# Patient Record
Sex: Female | Born: 2000 | Race: White | Hispanic: No | Marital: Single | Smoking: Never smoker
Health system: Southern US, Community
[De-identification: ages and names within clinical notes are randomized; demographics above are authoritative.]

## PROBLEM LIST (undated history)

## (undated) DIAGNOSIS — F419 Anxiety disorder, unspecified: Secondary | ICD-10-CM

## (undated) DIAGNOSIS — F32A Depression, unspecified: Secondary | ICD-10-CM

## (undated) HISTORY — PX: NO PAST SURGERIES: SHX2092

---

## 2021-12-01 NOTE — L&D Delivery Note (Addendum)
Operative Delivery Note Indication for operative vaginal delivery: Prolonged second stage  Patient was examined and found to be fully dilated with fetal station of +3.  Patient's bladder was noted to be empty, and there were no known fetal contraindications to operative vaginal delivery.  FHR tracing was Category I.  Risks of vacuum assistance were discussed in detail, including but not limited to, bleeding, infection, damage to maternal tissues, fetal cephalohematoma, inability to effect vaginal delivery of the head or shoulder dystocia that cannot be resolved by established maneuvers and need for emergency cesarean section.  Patient gave verbal consent.  The  Kiwi soft vacuum cup was positioned over the sagittal suture 3 cm anterior to posterior fontanelle.  Pressure was then increased to 500 mmHg, and the patient was instructed to push.  Pulling was administered along the pelvic curve while patient was pushing; there were 4 sets of contractions and one popoff. A midline episiotomy had to be performed to aid with delivery.  Vacuum was reduced in between contractions.  The infant was then delivered atraumatically at 0100, noted to be a viable female infant, Apgars of 9 and 9.  Tight nuchal cord x 1, reduced after delivery of head.  Neonatology present for delivery.  There was spontaneous placental delivery, intact with three-vessel cord.  The Mirena IUD was placed into fundal region of the uterus after delivery of the placenta, and the strings were cut to at the level of introitus.  The 2nd degree perineal laceration noted requiring repair with 3-0 Vicryl in the usual fashion. EBL 202 ml, epidural anesthesia in place.   Sponge, instrument and needle counts were correct x 2.  The patient and baby were stable after delivery and remained in couplet care, with plans to transfer later to postpartum unit  Vacuum assisted delivery was performed by Dr. Patrcia Dolly and I was available for assistance and  supervision during the delivery.    Jaynie Collins, MD, FACOG Obstetrician & Gynecologist, [facility] for [employer], [facility]

## 2022-01-09 ENCOUNTER — Other Ambulatory Visit: Payer: Self-pay

## 2022-01-09 ENCOUNTER — Encounter (HOSPITAL_COMMUNITY): Payer: Self-pay | Admitting: Obstetrics and Gynecology

## 2022-01-09 ENCOUNTER — Inpatient Hospital Stay (HOSPITAL_COMMUNITY)
Admission: AD | Admit: 2022-01-09 | Discharge: 2022-01-09 | Disposition: A | Discharge status: Home / Self Care | Payer: Medicaid Other | Attending: Obstetrics and Gynecology | Admitting: Obstetrics and Gynecology

## 2022-01-09 DIAGNOSIS — O26892 Other specified pregnancy related conditions, second trimester: Secondary | ICD-10-CM | POA: Diagnosis not present

## 2022-01-09 DIAGNOSIS — R109 Unspecified abdominal pain: Secondary | ICD-10-CM

## 2022-01-09 DIAGNOSIS — Z3A15 15 weeks gestation of pregnancy: Secondary | ICD-10-CM

## 2022-01-09 DIAGNOSIS — O99612 Diseases of the digestive system complicating pregnancy, second trimester: Secondary | ICD-10-CM | POA: Diagnosis not present

## 2022-01-09 DIAGNOSIS — K5900 Constipation, unspecified: Secondary | ICD-10-CM | POA: Diagnosis not present

## 2022-01-09 HISTORY — DX: Depression, unspecified: F32.A

## 2022-01-09 HISTORY — DX: Anxiety disorder, unspecified: F41.9

## 2022-01-09 LAB — URINALYSIS, ROUTINE W REFLEX MICROSCOPIC
Bilirubin Urine: NEGATIVE
Glucose, UA: 50 mg/dL — AB
Hgb urine dipstick: NEGATIVE
Ketones, ur: NEGATIVE mg/dL
Nitrite: NEGATIVE
Protein, ur: NEGATIVE mg/dL
Specific Gravity, Urine: 1.018 (ref 1.005–1.030)
pH: 6 (ref 5.0–8.0)

## 2022-01-09 LAB — WET PREP, GENITAL
Clue Cells Wet Prep HPF POC: NONE SEEN
Sperm: NONE SEEN
Trich, Wet Prep: NONE SEEN
WBC, Wet Prep HPF POC: <10 (ref <10)
Yeast Wet Prep HPF POC: NONE SEEN

## 2022-01-09 LAB — POCT PREGNANCY, URINE: Preg Test, Ur: POSITIVE — AB

## 2022-01-09 MED ORDER — POLYETHYLENE GLYCOL 3350 17 GM/SCOOP PO POWD: 17.0000 g | Freq: Every day | ORAL | 1 refills | Status: DC | PRN

## 2022-01-09 NOTE — MAU Note (Signed)
...  Deanna Campbell is a 21 y.o. at [redacted]w[redacted]d here in MAU reporting: Lower abdominal pain for "a couple of weeks now." She states she was evaluated with placenta previa two weeks ago. Has not had a BM since last Saturday. No VB or LOF.  She gets her care in [facility] and is wanting to transfer her care here to [facility] as [facility] does not take her insurance.  Pain score:  7/10 lower abdomen  Vitals:   01/09/22 1812  BP: 119/81  Pulse: (!) 106  Resp: 17  Temp: 99.3 F (37.4 C)  SpO2: 100%     FHT: 166 doppler Lab orders placed from triage:  UA

## 2022-01-09 NOTE — MAU Provider Note (Signed)
History     [id number]  Arrival date and time: 01/09/22 1745    Chief Complaint  Patient presents with   Abdominal Pain     HPI Leighla Chestnutt is a 21 y.o. at [redacted]w[redacted]d by outside [place], who presents for abdominal pain.   Patient reports several weeks of abdominal pain Comes and goes Mainly in lower quadrants  No vaginal bleeding or loss of fluid No burning or pain with urination Has not had BM since five days prior     OB History     Gravida  1   Para      Term      Preterm      AB      Living         SAB      IAB      Ectopic      Multiple      Live Births              Past Medical History:  Diagnosis Date   Anxiety    Depression     Past Surgical History:  Procedure Laterality Date   NO PAST SURGERIES      History reviewed. No pertinent family history.  Social History   Socioeconomic History   Marital status: Single    Spouse name: Not on file   Number of children: Not on file   Years of education: Not on file   Highest education level: Not on file  Occupational History   Not on file  Tobacco Use   Smoking status: Never   Smokeless tobacco: Never  Substance and Sexual Activity   Alcohol use: Never   Drug use: Never   Sexual activity: Not Currently  Other Topics Concern   Not on file  Social History Narrative   Not on file   Social Determinants of Health   Financial Resource Strain: Not on file  Food Insecurity: Not on file  Transportation Needs: Not on file  Physical Activity: Not on file  Stress: Not on file  Social Connections: Not on file  Intimate Partner Violence: Not on file    No Known Allergies  No current facility-administered medications on file prior to encounter.   Current Outpatient Medications on File Prior to Encounter  Medication Sig Dispense Refill   ondansetron (ZOFRAN) 4 MG tablet Take 4 mg by mouth every 8 (eight) hours as needed for nausea or vomiting.     Prenatal Vit-Fe Fumarate-FA  (MULTIVITAMIN-PRENATAL) 27-0.8 MG TABS tablet Take 1 tablet by mouth daily at 12 noon.     promethazine (PHENERGAN) 12.5 MG tablet Take 12.5 mg by mouth every 6 (six) hours as needed for nausea or vomiting.       ROS Pertinent positives and negative per HPI, all others reviewed and negative  Physical Exam   BP 118/69    Pulse 92    Temp 99.3 F (37.4 C) (Oral)    Resp 17    Ht 5\' 6"  (1.676 m)    Wt 62.2 kg    SpO2 100%    BMI 22.13 kg/m   Patient Vitals for the past 24 hrs:  BP Temp Temp src Pulse Resp SpO2 Height Weight  01/09/22 1827 118/69 -- -- 92 -- -- -- --  01/09/22 1812 119/81 99.3 F (37.4 C) Oral (!) 106 17 100 % 5\' 6"  (1.676 m) 62.2 kg    Physical Exam Vitals reviewed.  Constitutional:      General: She  is not in acute distress.    Appearance: She is well-developed. She is not diaphoretic.  Eyes:     General: No scleral icterus. Pulmonary:     Effort: Pulmonary effort is normal. No respiratory distress.  Abdominal:     General: There is no distension.     Palpations: Abdomen is soft.     Tenderness: There is no abdominal tenderness. There is no guarding or rebound.  Skin:    General: Skin is warm and dry.  Neurological:     Mental Status: She is alert.     Coordination: Coordination normal.     Cervical Exam    Bedside Ultrasound Pt informed that the ultrasound is considered a limited OB ultrasound and is not intended to be a complete ultrasound exam.  Patient also informed that the ultrasound is not being completed with the intent of assessing for fetal or placental anomalies or any pelvic abnormalities.  Explained that the purpose of todays ultrasound is to assess for  viability.  Patient acknowledges the purpose of the exam and the limitations of the study.    My interpretation: viable IUP, FHR 164 bpm by M mode, normal somatic movement and fluid, fundal placenta   Labs Results for orders placed or performed during the hospital encounter of 01/09/22  (from the past 24 hour(s))  Pregnancy, urine POC     Status: Abnormal   Collection Time: 01/09/22  6:08 PM  Result Value Ref Range   Preg Test, Ur POSITIVE (A) NEGATIVE  Wet prep, genital     Status: None   Collection Time: 01/09/22  7:13 PM  Result Value Ref Range   Yeast Wet Prep HPF POC NONE SEEN NONE SEEN   Trich, Wet Prep NONE SEEN NONE SEEN   Clue Cells Wet Prep HPF POC NONE SEEN NONE SEEN   WBC, Wet Prep HPF POC <10 <10   Sperm NONE SEEN     Imaging No results found.  MAU Course  Procedures Lab Orders         Wet prep, genital         Urinalysis, Routine w reflex microscopic Urine, Clean Catch         Pregnancy, urine POC    Meds ordered this encounter  Medications   polyethylene glycol powder (GLYCOLAX/MIRALAX) 17 GM/SCOOP powder    Sig: Take 17 g by mouth daily as needed.    Dispense:  510 g    Refill:  1   Imaging Orders  No imaging studies ordered today    MDM moderate  Assessment and Plan  #Abdominal pain in pregnancy, second trimester #[redacted] weeks gestation of pregnancy Suspect secondary to constipation. Unremarkable bedside US, no other notable symptoms. Wet prep negative. Given rx for miralax, reviewed routine precautions.   #FWB Normal fetal cardiac motion on bedside US   Dispo: discharged to home in stable condition.    Venora Maples, MD/MPH 01/09/22 7:39 PM  Allergies as of 01/09/2022   No Known Allergies      Medication List     TAKE these medications    multivitamin-prenatal 27-0.8 MG Tabs tablet Take 1 tablet by mouth daily at 12 noon.   ondansetron 4 MG tablet Commonly known as: ZOFRAN Take 4 mg by mouth every 8 (eight) hours as needed for nausea or vomiting.   polyethylene glycol powder 17 GM/SCOOP powder Commonly known as: GLYCOLAX/MIRALAX Take 17 g by mouth daily as needed.   promethazine 12.5 MG tablet Commonly known  as: PHENERGAN Take 12.5 mg by mouth every 6 (six) hours as needed for nausea or vomiting.

## 2022-01-10 LAB — GC/CHLAMYDIA PROBE AMP (~~LOC~~) NOT AT ARMC
Chlamydia: NEGATIVE
Comment: NEGATIVE
Comment: NORMAL
Neisseria Gonorrhea: NEGATIVE

## 2022-01-16 ENCOUNTER — Other Ambulatory Visit: Payer: Self-pay

## 2022-01-16 ENCOUNTER — Inpatient Hospital Stay (HOSPITAL_COMMUNITY)
Admission: AD | Admit: 2022-01-16 | Discharge: 2022-01-16 | Disposition: A | Discharge status: Home / Self Care | Payer: Medicaid Other | Attending: Obstetrics & Gynecology | Admitting: Obstetrics & Gynecology

## 2022-01-16 DIAGNOSIS — O26892 Other specified pregnancy related conditions, second trimester: Secondary | ICD-10-CM | POA: Insufficient documentation

## 2022-01-16 DIAGNOSIS — W109XXA Fall (on) (from) unspecified stairs and steps, initial encounter: Secondary | ICD-10-CM | POA: Diagnosis not present

## 2022-01-16 DIAGNOSIS — Z6791 Unspecified blood type, Rh negative: Secondary | ICD-10-CM | POA: Diagnosis not present

## 2022-01-16 DIAGNOSIS — R109 Unspecified abdominal pain: Secondary | ICD-10-CM | POA: Insufficient documentation

## 2022-01-16 DIAGNOSIS — Z3A16 16 weeks gestation of pregnancy: Secondary | ICD-10-CM | POA: Insufficient documentation

## 2022-01-16 DIAGNOSIS — S3991XA Unspecified injury of abdomen, initial encounter: Secondary | ICD-10-CM | POA: Diagnosis not present

## 2022-01-16 DIAGNOSIS — O26899 Other specified pregnancy related conditions, unspecified trimester: Secondary | ICD-10-CM

## 2022-01-16 DIAGNOSIS — O219 Vomiting of pregnancy, unspecified: Secondary | ICD-10-CM | POA: Diagnosis not present

## 2022-01-16 DIAGNOSIS — O9A212 Injury, poisoning and certain other consequences of external causes complicating pregnancy, second trimester: Secondary | ICD-10-CM | POA: Diagnosis not present

## 2022-01-16 DIAGNOSIS — O2342 Unspecified infection of urinary tract in pregnancy, second trimester: Secondary | ICD-10-CM | POA: Diagnosis not present

## 2022-01-16 LAB — ABO/RH: ABO/RH(D): O NEG

## 2022-01-16 MED ORDER — RHO D IMMUNE GLOBULIN 1500 UNIT/2ML IJ SOSY
300.0000 ug | PREFILLED_SYRINGE | Freq: Once | INTRAMUSCULAR | Status: AC
  Administered 2022-01-16: 300 ug via INTRAMUSCULAR
  Filled 2022-01-16: qty 2

## 2022-01-16 NOTE — Progress Notes (Signed)
Pending rhogam administration

## 2022-01-16 NOTE — Progress Notes (Signed)
RN notified Blood Bank regarding status of Rhophylac preparation.  Informed not ready, will notify once ready to dispense.

## 2022-01-16 NOTE — MAU Provider Note (Signed)
History     [id number]  Arrival date and time: 01/16/22 1205    Chief Complaint  Patient presents with   Fall     HPI Deanna Campbell is a 21 y.o. at [redacted]w[redacted]d by outside [place], who presents for abdominal trauma.   Patient seen by myself last week with abdominal pain Viable IUP seen on bedside US, discharged  Today reports that she fell down the stairs two days ago and hit her abdomen Has ongoing cramping and intermittent pain No vaginal bleeding No burning or pain with urination No vaginal discharge   --/--/O NEG (02/16 1253)  OB History     Gravida  1   Para      Term      Preterm      AB      Living         SAB      IAB      Ectopic      Multiple      Live Births              Past Medical History:  Diagnosis Date   Anxiety    Depression     Past Surgical History:  Procedure Laterality Date   NO PAST SURGERIES      No family history on file.  Social History   Socioeconomic History   Marital status: Single    Spouse name: Not on file   Number of children: Not on file   Years of education: Not on file   Highest education level: Not on file  Occupational History   Not on file  Tobacco Use   Smoking status: Never   Smokeless tobacco: Never  Substance and Sexual Activity   Alcohol use: Never   Drug use: Never   Sexual activity: Not Currently  Other Topics Concern   Not on file  Social History Narrative   Not on file   Social Determinants of Health   Financial Resource Strain: Not on file  Food Insecurity: Not on file  Transportation Needs: Not on file  Physical Activity: Not on file  Stress: Not on file  Social Connections: Not on file  Intimate Partner Violence: Not on file    No Known Allergies  No current facility-administered medications on file prior to encounter.   Current Outpatient Medications on File Prior to Encounter  Medication Sig Dispense Refill   ondansetron (ZOFRAN) 4 MG tablet Take 4 mg by mouth every 8  (eight) hours as needed for nausea or vomiting.     polyethylene glycol powder (GLYCOLAX/MIRALAX) 17 GM/SCOOP powder Take 17 g by mouth daily as needed. 510 g 1   Prenatal Vit-Fe Fumarate-FA (MULTIVITAMIN-PRENATAL) 27-0.8 MG TABS tablet Take 1 tablet by mouth daily at 12 noon.     promethazine (PHENERGAN) 12.5 MG tablet Take 12.5 mg by mouth every 6 (six) hours as needed for nausea or vomiting.       ROS Pertinent positives and negative per HPI, all others reviewed and negative  Physical Exam   BP 113/63 (BP Location: Right Arm)    Pulse 97    Temp 98 F (36.7 C) (Oral)    Resp 18    Ht 5\' 6"  (1.676 m)    Wt 61.1 kg    SpO2 100%    BMI 21.73 kg/m   Patient Vitals for the past 24 hrs:  BP Temp Temp src Pulse Resp SpO2 Height Weight  01/16/22 1225 113/63 98 F (36.7 C)  Oral 97 18 100 % -- --  01/16/22 1221 -- -- -- -- -- -- 5\' 6"  (1.676 m) 61.1 kg    Physical Exam Vitals reviewed.  Constitutional:      General: She is not in acute distress.    Appearance: She is well-developed. She is not diaphoretic.  Eyes:     General: No scleral icterus. Pulmonary:     Effort: Pulmonary effort is normal. No respiratory distress.  Skin:    General: Skin is warm and dry.  Neurological:     Mental Status: She is alert.     Coordination: Coordination normal.     Cervical Exam    Bedside Ultrasound Not done  My interpretation: n/a  FHT FHR 148 bpm by doppler  Labs Results for orders placed or performed during the hospital encounter of 01/16/22 (from the past 24 hour(s))  Rh IG workup (includes ABO/Rh)     Status: None (Preliminary result)   Collection Time: 01/16/22 12:49 PM  Result Value Ref Range   Gestational Age(Wks) 16    ABO/RH(D)      O NEG Performed at [facility], [address] [place], [place]    Antibody Screen PENDING   ABO/Rh     Status: None   Collection Time: 01/16/22 12:53 PM  Result Value Ref Range   ABO/RH(D) O NEG     Imaging No  results found.  MAU Course  Procedures Lab Orders  No laboratory test(s) ordered today   Meds ordered this encounter  Medications   rho (d) immune globulin (RHIG/RHOPHYLAC) injection 300 mcg   Imaging Orders  No imaging studies ordered today    MDM moderate  Assessment and Plan  #Abdominal trauma in pregnancy, second trimester #[redacted] weeks gestation of pregnancy #Rh neg blood type Patient with already known viable IUP presenting two days after abdominal trauma. Reassured patient that in absence of bleeding and with normal FHR that fetal well being appears to be good. Given direct abdominal trauma will be conservative and give rhogam given she is O neg blood type.    #FWB FHT normal by doppler   Dispo: discharged to home in stable condition.    01/18/22, MD/MPH 01/16/22 1:57 PM  Allergies as of 01/16/2022   No Known Allergies      Medication List     TAKE these medications    multivitamin-prenatal 27-0.8 MG Tabs tablet Take 1 tablet by mouth daily at 12 noon.   ondansetron 4 MG tablet Commonly known as: ZOFRAN Take 4 mg by mouth every 8 (eight) hours as needed for nausea or vomiting.   polyethylene glycol powder 17 GM/SCOOP powder Commonly known as: GLYCOLAX/MIRALAX Take 17 g by mouth daily as needed.   promethazine 12.5 MG tablet Commonly known as: PHENERGAN Take 12.5 mg by mouth every 6 (six) hours as needed for nausea or vomiting.

## 2022-01-16 NOTE — MAU Note (Signed)
Presents stating she fell down the steps 2 days ago and struck abdomen.  Reports abdomen has been sore since fall.  Denies VB or LOF.

## 2022-01-17 ENCOUNTER — Inpatient Hospital Stay (HOSPITAL_COMMUNITY)
Admission: AD | Admit: 2022-01-17 | Discharge: 2022-01-17 | Disposition: A | Discharge status: Home / Self Care | Payer: Medicaid Other | Attending: Obstetrics & Gynecology | Admitting: Obstetrics & Gynecology

## 2022-01-17 ENCOUNTER — Ambulatory Visit: Payer: Self-pay | Admitting: *Deleted

## 2022-01-17 ENCOUNTER — Encounter (HOSPITAL_COMMUNITY): Payer: Self-pay | Admitting: Obstetrics & Gynecology

## 2022-01-17 DIAGNOSIS — M549 Dorsalgia, unspecified: Secondary | ICD-10-CM | POA: Insufficient documentation

## 2022-01-17 DIAGNOSIS — O26892 Other specified pregnancy related conditions, second trimester: Secondary | ICD-10-CM | POA: Insufficient documentation

## 2022-01-17 DIAGNOSIS — N390 Urinary tract infection, site not specified: Secondary | ICD-10-CM | POA: Insufficient documentation

## 2022-01-17 DIAGNOSIS — O219 Vomiting of pregnancy, unspecified: Secondary | ICD-10-CM

## 2022-01-17 DIAGNOSIS — O2342 Unspecified infection of urinary tract in pregnancy, second trimester: Secondary | ICD-10-CM | POA: Diagnosis not present

## 2022-01-17 DIAGNOSIS — R109 Unspecified abdominal pain: Secondary | ICD-10-CM | POA: Diagnosis not present

## 2022-01-17 DIAGNOSIS — Z3A16 16 weeks gestation of pregnancy: Secondary | ICD-10-CM

## 2022-01-17 LAB — URINALYSIS, ROUTINE W REFLEX MICROSCOPIC
Bilirubin Urine: NEGATIVE
Glucose, UA: NEGATIVE mg/dL
Hgb urine dipstick: NEGATIVE
Ketones, ur: 80 mg/dL — AB
Nitrite: POSITIVE — AB
Protein, ur: 30 mg/dL — AB
Specific Gravity, Urine: 1.021 (ref 1.005–1.030)
pH: 7 (ref 5.0–8.0)

## 2022-01-17 LAB — CBC WITH DIFFERENTIAL/PLATELET
Abs Immature Granulocytes: 0.04 10*3/uL (ref 0.00–0.07)
Basophils Absolute: 0 10*3/uL (ref 0.0–0.1)
Basophils Relative: 0 %
Eosinophils Absolute: 0 10*3/uL (ref 0.0–0.5)
Eosinophils Relative: 0 %
HCT: 34.1 % — ABNORMAL LOW (ref 36.0–46.0)
Hemoglobin: 11.8 g/dL — ABNORMAL LOW (ref 12.0–15.0)
Immature Granulocytes: 0 %
Lymphocytes Relative: 4 %
Lymphs Abs: 0.4 10*3/uL — ABNORMAL LOW (ref 0.7–4.0)
MCH: 31.3 pg (ref 26.0–34.0)
MCHC: 34.6 g/dL (ref 30.0–36.0)
MCV: 90.5 fL (ref 80.0–100.0)
Monocytes Absolute: 0.3 10*3/uL (ref 0.1–1.0)
Monocytes Relative: 3 %
Neutro Abs: 9.2 10*3/uL — ABNORMAL HIGH (ref 1.7–7.7)
Neutrophils Relative %: 93 %
Platelets: 259 10*3/uL (ref 150–400)
RBC: 3.77 MIL/uL — ABNORMAL LOW (ref 3.87–5.11)
RDW: 13.1 % (ref 11.5–15.5)
WBC: 10 10*3/uL (ref 4.0–10.5)
nRBC: 0 % (ref 0.0–0.2)

## 2022-01-17 LAB — COMPREHENSIVE METABOLIC PANEL
ALT: 12 U/L (ref 0–44)
AST: 16 U/L (ref 15–41)
Albumin: 3.5 g/dL (ref 3.5–5.0)
Alkaline Phosphatase: 63 U/L (ref 38–126)
Anion gap: 9 (ref 5–15)
BUN: 5 mg/dL — ABNORMAL LOW (ref 6–20)
CO2: 21 mmol/L — ABNORMAL LOW (ref 22–32)
Calcium: 9.3 mg/dL (ref 8.9–10.3)
Chloride: 106 mmol/L (ref 98–111)
Creatinine, Ser: 0.58 mg/dL (ref 0.44–1.00)
GFR, Estimated: >60 mL/min (ref >60)
Glucose, Bld: 99 mg/dL (ref 70–99)
Potassium: 3.8 mmol/L (ref 3.5–5.1)
Sodium: 136 mmol/L (ref 135–145)
Total Bilirubin: 0.5 mg/dL (ref 0.3–1.2)
Total Protein: 7 g/dL (ref 6.5–8.1)

## 2022-01-17 LAB — RH IG WORKUP (INCLUDES ABO/RH)
ABO/RH(D): O NEG
Antibody Screen: POSITIVE
Gestational Age(Wks): 16
Unit division: 0

## 2022-01-17 LAB — LIPASE, BLOOD: Lipase: 25 U/L (ref 11–51)

## 2022-01-17 MED ORDER — ONDANSETRON HCL 4 MG PO TABS: 4.0000 mg | ORAL_TABLET | Freq: Three times a day (TID) | ORAL | 2 refills | Status: DC | PRN

## 2022-01-17 MED ORDER — SODIUM CHLORIDE 0.9 % IV SOLN
2.0000 g | INTRAVENOUS | Status: DC
  Administered 2022-01-17: 2 g via INTRAVENOUS
  Filled 2022-01-17: qty 20

## 2022-01-17 MED ORDER — FAMOTIDINE IN NACL 20-0.9 MG/50ML-% IV SOLN
20.0000 mg | Freq: Once | INTRAVENOUS | Status: AC
  Administered 2022-01-17: 20 mg via INTRAVENOUS
  Filled 2022-01-17: qty 50

## 2022-01-17 MED ORDER — SODIUM CHLORIDE 0.9 % IV SOLN: INTRAVENOUS | Status: DC

## 2022-01-17 MED ORDER — LACTATED RINGERS IV BOLUS
1000.0000 mL | Freq: Once | INTRAVENOUS | Status: AC
Start: 2022-01-17 — End: 2022-01-17
  Administered 2022-01-17: 1000 mL via INTRAVENOUS

## 2022-01-17 MED ORDER — SODIUM CHLORIDE 0.9 % IV SOLN
8.0000 mg | Freq: Once | INTRAVENOUS | Status: AC
  Administered 2022-01-17: 8 mg via INTRAVENOUS
  Filled 2022-01-17: qty 4

## 2022-01-17 MED ORDER — CEFADROXIL 500 MG PO CAPS: 500.0000 mg | ORAL_CAPSULE | Freq: Two times a day (BID) | ORAL | 0 refills | Status: DC

## 2022-01-17 NOTE — MAU Provider Note (Signed)
History     CSN: [id number]  Arrival date and time: 01/17/22 [id number]   Event Date/Time   First Provider Initiated Contact with Patient 01/17/22 1121      Chief Complaint  Patient presents with   Emesis   Nausea   Dizziness   HPI  Ms.Deanna Campbell is a 21 y.o. female G1P0 @ [redacted]w[redacted]d who presents with nausea and vomiting. This is a new problem. She reports vomiting daily 1x up until yesterday when the vomiting became worse. She has vomited more than 10 times in the last 24 hours. She reports stomach cramping. Which is not a new problem. She has had cramping throughout the pregnancy. She has no sick contacts. No fever, no diarrhea. She has not taken anything for the nausea.   OB History     Gravida  1   Para      Term      Preterm      AB      Living         SAB      IAB      Ectopic      Multiple      Live Births              Past Medical History:  Diagnosis Date   Anxiety    Depression     Past Surgical History:  Procedure Laterality Date   NO PAST SURGERIES      No family history on file.  Social History   Tobacco Use   Smoking status: Never   Smokeless tobacco: Never  Substance Use Topics   Alcohol use: Never   Drug use: Never    Allergies: No Known Allergies  Medications Prior to Admission  Medication Sig Dispense Refill Last Dose   ondansetron (ZOFRAN) 4 MG tablet Take 4 mg by mouth every 8 (eight) hours as needed for nausea or vomiting.   01/17/2022   Prenatal Vit-Fe Fumarate-FA (MULTIVITAMIN-PRENATAL) 27-0.8 MG TABS tablet Take 1 tablet by mouth daily at 12 noon.   01/16/2022   promethazine (PHENERGAN) 12.5 MG tablet Take 12.5 mg by mouth every 6 (six) hours as needed for nausea or vomiting.   01/17/2022   polyethylene glycol powder (GLYCOLAX/MIRALAX) 17 GM/SCOOP powder Take 17 g by mouth daily as needed. 510 g 1    Results for orders placed or performed during the hospital encounter of 01/17/22 (from the past 72 hour(s))  Urinalysis,  Routine w reflex microscopic Urine, Clean Catch     Status: Abnormal   Collection Time: 01/17/22 10:44 AM  Result Value Ref Range   Color, Urine AMBER (A) YELLOW    Comment: BIOCHEMICALS MAY BE AFFECTED BY COLOR   APPearance CLOUDY (A) CLEAR   Specific Gravity, Urine 1.021 1.005 - 1.030   pH 7.0 5.0 - 8.0   Glucose, UA NEGATIVE NEGATIVE mg/dL   Hgb urine dipstick NEGATIVE NEGATIVE   Bilirubin Urine NEGATIVE NEGATIVE   Ketones, ur 80 (A) NEGATIVE mg/dL   Protein, ur 30 (A) NEGATIVE mg/dL   Nitrite POSITIVE (A) NEGATIVE   Leukocytes,Ua TRACE (A) NEGATIVE   RBC / HPF 0-5 0 - 5 RBC/hpf   WBC, UA 21-50 0 - 5 WBC/hpf   Bacteria, UA MANY (A) NONE SEEN   Squamous Epithelial / LPF 11-20 0 - 5   Mucus PRESENT     Comment: Performed at [facility], [address], [place], [place] [address]  CBC with Differential/Platelet  Status: Abnormal   Collection Time: 01/17/22 11:39 AM  Result Value Ref Range   WBC 10.0 4.0 - 10.5 K/uL   RBC 3.77 (L) 3.87 - 5.11 MIL/uL   Hemoglobin 11.8 (L) 12.0 - 15.0 g/dL   HCT 34.1 (L) 36.0 - 46.0 %   MCV 90.5 80.0 - 100.0 fL   MCH 31.3 26.0 - 34.0 pg   MCHC 34.6 30.0 - 36.0 g/dL   RDW 13.1 11.5 - 15.5 %   Platelets 259 150 - 400 K/uL   nRBC 0.0 0.0 - 0.2 %   Neutrophils Relative % 93 %   Neutro Abs 9.2 (H) 1.7 - 7.7 K/uL   Lymphocytes Relative 4 %   Lymphs Abs 0.4 (L) 0.7 - 4.0 K/uL   Monocytes Relative 3 %   Monocytes Absolute 0.3 0.1 - 1.0 K/uL   Eosinophils Relative 0 %   Eosinophils Absolute 0.0 0.0 - 0.5 K/uL   Basophils Relative 0 %   Basophils Absolute 0.0 0.0 - 0.1 K/uL   Immature Granulocytes 0 %   Abs Immature Granulocytes 0.04 0.00 - 0.07 K/uL    Comment: Performed at [facility], [address], [place], [place] [address]  Comprehensive metabolic panel     Status: Abnormal   Collection Time: 01/17/22 11:39 AM  Result Value Ref Range   Sodium 136 135 - 145 mmol/L   Potassium 3.8 3.5 - 5.1 mmol/L   Chloride 106 98 - 111  mmol/L   CO2 21 (L) 22 - 32 mmol/L   Glucose, Bld 99 70 - 99 mg/dL    Comment: Glucose reference range applies only to samples taken after fasting for at least 8 hours.   BUN 5 (L) 6 - 20 mg/dL   Creatinine, Ser 0.58 0.44 - 1.00 mg/dL   Calcium 9.3 8.9 - 10.3 mg/dL   Total Protein 7.0 6.5 - 8.1 g/dL   Albumin 3.5 3.5 - 5.0 g/dL   AST 16 15 - 41 U/L   ALT 12 0 - 44 U/L   Alkaline Phosphatase 63 38 - 126 U/L   Total Bilirubin 0.5 0.3 - 1.2 mg/dL   GFR, Estimated >60 >60 mL/min    Comment: (NOTE) Calculated using the CKD-EPI Creatinine Equation (2021)    Anion gap 9 5 - 15    Comment: Performed at [facility] [address] [place], [place] [address]  Lipase, blood     Status: None   Collection Time: 01/17/22 11:39 AM  Result Value Ref Range   Lipase 25 11 - 51 U/L    Comment: Performed at [facility] [address] [place], [place] [address]     Review of Systems  Constitutional:  Negative for fever.  Gastrointestinal:  Positive for abdominal pain, nausea and vomiting.  Genitourinary:  Negative for dysuria, flank pain and vaginal bleeding.  Musculoskeletal:  Positive for back pain.  Physical Exam   Blood pressure 100/83, pulse (!) 102, temperature 98.1 F (36.7 C), resp. rate 18, height 5\' 6"  (1.676 m), weight 59.4 kg.  Physical Exam Vitals and nursing note reviewed.  Constitutional:      General: She is not in acute distress.    Appearance: Normal appearance. She is ill-appearing. She is not toxic-appearing.  HENT:     Head: Normocephalic.  Pulmonary:     Breath sounds: Normal breath sounds.  Abdominal:     Palpations: Abdomen is soft.     Tenderness: There is no  abdominal tenderness. There is no right CVA tenderness or left CVA tenderness.     Hernia: No hernia is present.  Musculoskeletal:        General: Normal range of motion.  Skin:    General: Skin is warm.     Coloration: Skin is pale.  Neurological:     Mental Status: She is alert and  oriented to person, place, and time.   MAU Course  Procedures  MDM  UA showing 80+ ketones with nitrites Urine culture pending. She is without flank pain/fever or leukocytosis. LR bolus x 2 with Rocephin 2 grams, Zofran, and Pepcid Patient tolerating oral fluids in MAU without vomiting.   Assessment and Plan   1. Nausea and vomiting during pregnancy   2. UTI in pregnancy, antepartum, second trimester   3. [redacted] weeks gestation of pregnancy      P:  Discharge home Strict return precautions Rx: Duricef, Zofran Small, bland meals. Return to MAU if symptoms worsen   Noni Saupe 01/17/2022, 6:55 PM

## 2022-01-17 NOTE — Telephone Encounter (Signed)
° ° ° ° °  Chief Complaint: Vomiting Symptoms: vomiting, took zofran, phenergan, inefective Frequency: last evening, again at 0400, "Non stop" Pertinent Negatives: Patient denies  Disposition: [] ED /[] Urgent Care (no appt availability in office) / [] Appointment(In office/virtual)/ []  [facility]/ [] Home Care/ [] Refused Recommended Disposition /[] [facility]/ []  Follow-up with PCP Additional Notes: Pt is [redacted] weeks pregnant. Advised ED.  Reason for Disposition  [1] SEVERE vomiting (e.g., 6 or more times/day) AND [2] present > 8 hours (Exception: patient sounds well, is drinking liquids, does not sound dehydrated, and vomiting has lasted less than 24 hours)  Answer Assessment - Initial Assessment Questions 1. VOMITING SEVERITY: "How many times have you vomited in the past 24 hours?"     - MILD:  1 - 2 times/day    - MODERATE: 3 - 5 times/day, decreased oral intake without significant weight loss or symptoms of dehydration    - SEVERE: 6 or more times/day, vomits everything or nearly everything, with significant weight loss, symptoms of dehydration      "CAn't stop since 4am." Started yesterday evening, stopped, started again at 0400 2. ONSET: "When did the vomiting begin?"       3. FLUIDS: "What fluids or food have you vomited up today?" "Have you been able to keep any fluids down?"     none 4. ABDOMINAL PAIN: "Are your having any abdominal pain?" If yes : "How bad is it and what does it feel like?" (e.g., crampy, dull, intermittent, constant)      *No Answer* 5. DIARRHEA: "Is there any diarrhea?" If Yes, ask: "How many times today?"      *No Answer* 6. CONTACTS: "Is there anyone else in the family with the same symptoms?"      *No Answer* 7. CAUSE: "What do you think is causing your vomiting?"     *No Answer* 8. HYDRATION STATUS: "Any signs of dehydration?" (e.g., dry mouth [not only dry lips], too weak to stand) "When did you last urinate?"     *No Answer* 9. OTHER  SYMPTOMS: "Do you have any other symptoms?" (e.g., fever, headache, vertigo, vomiting blood or coffee grounds, recent head injury)     *No Answer* 10. PREGNANCY: "Is there any chance you are pregnant?" "When was your last menstrual period?"       Pt is [redacted] weeks pregnant  Protocols used: Vomiting-A-AH

## 2022-01-17 NOTE — MAU Note (Signed)
Pt reports she received an Rhogam injection yesterday and has been feeling nauseated and dizzy on and off since. Vomited several times took zofran and promethazine without relief.  C/o abd cramping as well.

## 2022-01-19 LAB — CULTURE, OB URINE: Culture: >=100000 — AB

## 2022-02-03 ENCOUNTER — Inpatient Hospital Stay (HOSPITAL_COMMUNITY)
Admission: AD | Admit: 2022-02-03 | Discharge: 2022-02-03 | Disposition: A | Discharge status: Home / Self Care | Payer: Medicaid Other | Attending: Obstetrics and Gynecology | Admitting: Obstetrics and Gynecology

## 2022-02-03 ENCOUNTER — Other Ambulatory Visit: Payer: Self-pay

## 2022-02-03 DIAGNOSIS — R109 Unspecified abdominal pain: Secondary | ICD-10-CM | POA: Diagnosis not present

## 2022-02-03 DIAGNOSIS — O99612 Diseases of the digestive system complicating pregnancy, second trimester: Secondary | ICD-10-CM | POA: Insufficient documentation

## 2022-02-03 DIAGNOSIS — R1030 Lower abdominal pain, unspecified: Secondary | ICD-10-CM | POA: Insufficient documentation

## 2022-02-03 DIAGNOSIS — O26892 Other specified pregnancy related conditions, second trimester: Secondary | ICD-10-CM | POA: Insufficient documentation

## 2022-02-03 DIAGNOSIS — K5900 Constipation, unspecified: Secondary | ICD-10-CM | POA: Diagnosis not present

## 2022-02-03 DIAGNOSIS — Z3A18 18 weeks gestation of pregnancy: Secondary | ICD-10-CM | POA: Diagnosis not present

## 2022-02-03 DIAGNOSIS — O26899 Other specified pregnancy related conditions, unspecified trimester: Secondary | ICD-10-CM

## 2022-02-03 LAB — URINALYSIS, ROUTINE W REFLEX MICROSCOPIC
Bilirubin Urine: NEGATIVE
Glucose, UA: NEGATIVE mg/dL
Hgb urine dipstick: NEGATIVE
Ketones, ur: NEGATIVE mg/dL
Leukocytes,Ua: NEGATIVE
Nitrite: NEGATIVE
Protein, ur: NEGATIVE mg/dL
Specific Gravity, Urine: 1.011 (ref 1.005–1.030)
pH: 6 (ref 5.0–8.0)

## 2022-02-03 MED ORDER — DOCUSATE SODIUM 250 MG PO CAPS: 250.0000 mg | ORAL_CAPSULE | Freq: Every day | ORAL | 2 refills | Status: DC

## 2022-02-03 MED ORDER — ACETAMINOPHEN 500 MG PO TABS
1000.0000 mg | ORAL_TABLET | Freq: Once | ORAL | Status: AC
Start: 2022-02-03 — End: 2022-02-03
  Administered 2022-02-03: 1000 mg via ORAL
  Filled 2022-02-03: qty 2

## 2022-02-03 NOTE — MAU Note (Signed)
Deanna Campbell is a 21 y.o. at [redacted]w[redacted]d here in MAU reporting: Pt reporting abdominal cramping. Also thinks she's dehydrated. Says she drinks 1 bottle of water a day. No bleeding. ?LMP:  ?Onset of complaint: 02/03/22 ?Pain score: 7/10 ?Vitals:  ? 02/03/22 2111  ?BP: 116/69  ?Pulse: 84  ?Resp: 16  ?Temp: 98.3 ?F (36.8 ?C)  ?   ?FHT:155 ? ?Lab orders placed from triage:  ? ?

## 2022-02-03 NOTE — MAU Provider Note (Signed)
?History  ?  ? ?CSN: [id number] ? ?Arrival date and time: 02/03/22 2042 ? ? Event Date/Time  ? First Provider Initiated Contact with Patient 02/03/22 2313   ?  ? ?Chief Complaint  ?Patient presents with  ? Abdominal Pain  ? ?HPI ?Deanna Campbell is a 21 y.o. G1P0 at [redacted]w[redacted]d who presents with lower abdominal cramping. She states she thinks she is dehydrated and that is causing the cramping. She drinks 1 bottle of water a day because she doesn't like it. She rates the pain a 4/10 and has not taken anything for the pain. She also reports issues with constipation. She takes Miralax daily with no relief. She denies any urinary symptoms. She denies any bleeding or abnormal discharge.  ? ?OB History   ? ? Gravida  ?1  ? Para  ?   ? Term  ?   ? Preterm  ?   ? AB  ?   ? Living  ?   ?  ? ? SAB  ?   ? IAB  ?   ? Ectopic  ?   ? Multiple  ?   ? Live Births  ?   ?   ?  ?  ? ? ?Past Medical History:  ?Diagnosis Date  ? Anxiety   ? Depression   ? ? ?Past Surgical History:  ?Procedure Laterality Date  ? NO PAST SURGERIES    ? ? ?No family history on file. ? ?Social History  ? ?Tobacco Use  ? Smoking status: Never  ? Smokeless tobacco: Never  ?Substance Use Topics  ? Alcohol use: Never  ? Drug use: Never  ? ? ?Allergies: No Known Allergies ? ?Medications Prior to Admission  ?Medication Sig Dispense Refill Last Dose  ? cefadroxil (DURICEF) 500 MG capsule Take 1 capsule (500 mg total) by mouth 2 (two) times daily. 14 capsule 0   ? ondansetron (ZOFRAN) 4 MG tablet Take 1 tablet (4 mg total) by mouth every 8 (eight) hours as needed for nausea or vomiting. 20 tablet 2   ? polyethylene glycol powder (GLYCOLAX/MIRALAX) 17 GM/SCOOP powder Take 17 g by mouth daily as needed. 510 g 1   ? Prenatal Vit-Fe Fumarate-FA (MULTIVITAMIN-PRENATAL) 27-0.8 MG TABS tablet Take 1 tablet by mouth daily at 12 noon.     ? promethazine (PHENERGAN) 12.5 MG tablet Take 12.5 mg by mouth every 6 (six) hours as needed for nausea or vomiting.     ? ? ?Review of Systems   ?Constitutional: Negative.  Negative for fatigue and fever.  ?HENT: Negative.    ?Respiratory: Negative.  Negative for shortness of breath.   ?Cardiovascular: Negative.  Negative for chest pain.  ?Gastrointestinal:  Positive for abdominal pain. Negative for constipation, diarrhea, nausea and vomiting.  ?Genitourinary: Negative.  Negative for dysuria, vaginal bleeding and vaginal discharge.  ?Neurological: Negative.  Negative for dizziness and headaches.  ?Physical Exam  ? ?Blood pressure 116/69, pulse 84, temperature 98.3 ?F (36.8 ?C), temperature source Oral, resp. rate 16, height 5\' 6"  (1.676 m), weight 64.6 kg. ? ?Physical Exam ?Vitals and nursing note reviewed.  ?Constitutional:   ?   General: She is not in acute distress. ?   Appearance: She is well-developed.  ?HENT:  ?   Head: Normocephalic.  ?Eyes:  ?   Pupils: Pupils are equal, round, and reactive to light.  ?Cardiovascular:  ?   Rate and Rhythm: Normal rate and regular rhythm.  ?   Heart sounds: Normal heart sounds.  ?Pulmonary:  ?  Effort: Pulmonary effort is normal. No respiratory distress.  ?   Breath sounds: Normal breath sounds.  ?Abdominal:  ?   General: Bowel sounds are normal. There is no distension.  ?   Palpations: Abdomen is soft.  ?   Tenderness: There is no abdominal tenderness.  ?Skin: ?   General: Skin is warm and dry.  ?Neurological:  ?   Mental Status: She is alert and oriented to person, place, and time.  ?Psychiatric:     ?   Mood and Affect: Mood normal.     ?   Behavior: Behavior normal.     ?   Thought Content: Thought content normal.     ?   Judgment: Judgment normal.  ? ?FHT: 155 bpm ? ?Cervix: closed/thick/posterior ? ?MAU Course  ?Procedures ?Results for orders placed or performed during the hospital encounter of 02/03/22 (from the past 24 hour(s))  ?Urinalysis, Routine w reflex microscopic Urine, Clean Catch     Status: None  ? Collection Time: 02/03/22  9:49 PM  ?Result Value Ref Range  ? Color, Urine YELLOW YELLOW  ?  APPearance CLEAR CLEAR  ? Specific Gravity, Urine 1.011 1.005 - 1.030  ? pH 6.0 5.0 - 8.0  ? Glucose, UA NEGATIVE NEGATIVE mg/dL  ? Hgb urine dipstick NEGATIVE NEGATIVE  ? Bilirubin Urine NEGATIVE NEGATIVE  ? Ketones, ur NEGATIVE NEGATIVE mg/dL  ? Protein, ur NEGATIVE NEGATIVE mg/dL  ? Nitrite NEGATIVE NEGATIVE  ? Leukocytes,Ua NEGATIVE NEGATIVE  ?  ?MDM ?UA ?Tylenol PO ? ?Assessment and Plan  ? ?1. Abdominal pain affecting pregnancy   ?2. [redacted] weeks gestation of pregnancy   ? ?-Discharge home in stable condition ?-Rx for colace sent to patient's pharmacy ?-Second trimester precautions discussed ?-Patient advised to follow-up with OB as scheduled for prenatal care ?-Patient may return to MAU as needed or if her condition were to change or worsen ? ? ?Rolm Bookbinder CNM ?02/03/2022, 11:13 PM  ?

## 2022-02-03 NOTE — Discharge Instructions (Signed)

## 2022-02-13 ENCOUNTER — Other Ambulatory Visit: Payer: Self-pay

## 2022-02-13 ENCOUNTER — Ambulatory Visit (INDEPENDENT_AMBULATORY_CARE_PROVIDER_SITE_OTHER): Payer: Medicaid Other | Admitting: Obstetrics and Gynecology

## 2022-02-13 VITALS — BP 112/69 | HR 80 | Wt 145.0 lb

## 2022-02-13 DIAGNOSIS — F32A Depression, unspecified: Secondary | ICD-10-CM

## 2022-02-13 DIAGNOSIS — Z3A20 20 weeks gestation of pregnancy: Secondary | ICD-10-CM

## 2022-02-13 DIAGNOSIS — Z3402 Encounter for supervision of normal first pregnancy, second trimester: Secondary | ICD-10-CM

## 2022-02-13 DIAGNOSIS — O0930 Supervision of pregnancy with insufficient antenatal care, unspecified trimester: Secondary | ICD-10-CM

## 2022-02-13 DIAGNOSIS — Z6791 Unspecified blood type, Rh negative: Secondary | ICD-10-CM

## 2022-02-13 DIAGNOSIS — O26899 Other specified pregnancy related conditions, unspecified trimester: Secondary | ICD-10-CM

## 2022-02-13 NOTE — Progress Notes (Signed)
New OB Note  ?02/13/2022  ? ?Clinic: [facility]  ? ?Chief Complaint: new OB  ? ?Transfer of Care Patient: no ? ?History of Present Illness: Deanna Campbell is a 21 y.o. G1P0 @ 20/1 weeks (EDC 7/29 [tentative], based on Patient's last menstrual period was 09/21/2021.).  Preg complicated by has Rh negative state in antepartum period; Encounter for supervision of normal pregnancy; and Late prenatal care on their problem list.  ? ?Any events prior to today's visit: patient has been to MAU four times for pain, a fall, n/v. S/p rhogam on 2/16 ?Her periods were: qmonth, regular ?She was using no method when she conceived.  ?She has Negative signs or symptoms of nausea/vomiting of pregnancy. ?She has Negative signs or symptoms of miscarriage or preterm labor ?On any medications around the time she conceived/early pregnancy: patient unsure of all the meds but she was on lexapro and buspar but took herself off when she found she was pregnant.  ? ?ROS: A 12-point review of systems was performed and negative, except as stated in the above HPI. ? ?OBGYN History: As per HPI. ?OB History  ?Gravida Para Term Preterm AB Living  ?1            ?SAB IAB Ectopic Multiple Live Births  ?           ?  ?# Outcome Date GA Lbr Len/2nd Weight Sex Delivery Anes PTL Lv  ?1 Current           ? ?Past Medical History: ?Past Medical History:  ?Diagnosis Date  ? Anxiety   ? Depression   ? ? ?Past Surgical History: ?Past Surgical History:  ?Procedure Laterality Date  ? NO PAST SURGERIES    ? ? ?Family History:  ?She denies any history of mental retardation, birth defects or genetic disorders in her or the FOB's history ? ?Social History:  ?Social History  ? ?Socioeconomic History  ? Marital status: Single  ?  Spouse name: Not on file  ? Number of children: Not on file  ? Years of education: Not on file  ? Highest education level: Not on file  ?Occupational History  ? Not on file  ?Tobacco Use  ? Smoking status: Never  ?  Smokeless tobacco: Never  ?Substance and Sexual Activity  ? Alcohol use: Never  ? Drug use: Never  ? Sexual activity: Not Currently  ?Other Topics Concern  ? Not on file  ?Social History Narrative  ? Not on file  ? ?Social Determinants of Health  ? ?Financial Resource Strain: Not on file  ?Food Insecurity: Not on file  ?Transportation Needs: Not on file  ?Physical Activity: Not on file  ?Stress: Not on file  ?Social Connections: Not on file  ?Intimate Partner Violence: Not on file  ? ? ?Allergy: ?No Known Allergies ? ?Current Outpatient Medications: ?Prenatal vitamin ? ?Physical Exam: ?  ?BP 112/69   Pulse 80   Wt 145 lb (65.8 kg)   LMP 09/21/2021   BMI 23.40 kg/m?  ?Body mass index is 23.4 kg/m?Marland Kitchen ?Contractions: Not present ?Vag. Bleeding: None. ?Fundal height: 20 ?FHTs: 150s ? ?General appearance: Well nourished, well developed female in no acute distress.  ?Cardiovascular: S1, S2 normal, no murmur, rub or gallop, regular rate and rhythm ?Respiratory:  Clear to auscultation bilateral. Normal respiratory effort ?Abdomen: gravid, nttp ?Neuro/Psych:  Normal mood and affect.  ?Skin:  Warm and dry.  ? ?Laboratory: ?MAU labs reviewed ? ?Imaging:  ?none ? ?Assessment:  pt doing well ? ?Plan: ?1. Encounter for supervision of normal first pregnancy in second trimester ?- [place] MFM OB COMP + 14 WK; Future ?- CBC/D/Plt+RPR+Rh+ABO+RubIgG... ?- AFP, Serum, Open Spina Bifida ?- Genetic Screening ? ?2. Late prenatal care ? ?3. [redacted] weeks gestation of pregnancy ? ?4. Rh negative state in antepartum period ?Ab screen and repeat rhogam at 28wks ? ?5. Depression ?Pt states she was getting medications via her PCP at [facility]. I told her that buspar and lexapro are fine to take with pregnancy and to let us know what the other meds are. I told her that at least of for the buspar and lexapro, that if she felt like she needed to restart to contact her PCP but from a pregnancy standpoint it would be fine ? ?Problem list  reviewed and updated. ? ?Follow up in 4 weeks. ? ?The nature of [facility] with multiple MDs and other Advanced Practice Providers was explained to patient; also emphasized that residents, students are part of our team. ? ?>50% of 30 min visit spent on counseling and coordination of care.  ?  ? ?Cornelia Copa MD ?Attending ?[facility] [employer] [facility]) ? ?

## 2022-02-15 LAB — CBC/D/PLT+RPR+RH+ABO+RUBIGG...
Basophils Absolute: 0.1 10*3/uL (ref 0.0–0.2)
Basos: 1 %
EOS (ABSOLUTE): 0.2 10*3/uL (ref 0.0–0.4)
Eos: 3 %
HCV Ab: NONREACTIVE
HIV Screen 4th Generation wRfx: NONREACTIVE
Hematocrit: 31.7 % — ABNORMAL LOW (ref 34.0–46.6)
Hemoglobin: 10.5 g/dL — ABNORMAL LOW (ref 11.1–15.9)
Hepatitis B Surface Ag: NEGATIVE
Immature Grans (Abs): 0 10*3/uL (ref 0.0–0.1)
Immature Granulocytes: 0 %
Lymphocytes Absolute: 1.2 10*3/uL (ref 0.7–3.1)
Lymphs: 16 %
MCH: 31.4 pg (ref 26.6–33.0)
MCHC: 33.1 g/dL (ref 31.5–35.7)
MCV: 95 fL (ref 79–97)
Monocytes Absolute: 0.6 10*3/uL (ref 0.1–0.9)
Monocytes: 8 %
Neutrophils Absolute: 5.3 10*3/uL (ref 1.4–7.0)
Neutrophils: 72 %
Platelets: 275 10*3/uL (ref 150–450)
RBC: 3.34 x10E6/uL — ABNORMAL LOW (ref 3.77–5.28)
RDW: 13.8 % (ref 11.7–15.4)
RPR Ser Ql: NONREACTIVE
Rh Factor: NEGATIVE
Rubella Antibodies, IGG: 2.08 index (ref >0.99)
WBC: 7.3 10*3/uL (ref 3.4–10.8)

## 2022-02-15 LAB — AFP, SERUM, OPEN SPINA BIFIDA
AFP MoM: 1.54
AFP Value: 93.3 ng/mL
Gest. Age on Collection Date: 20.1 weeks
Maternal Age At EDD: 19.7 yr
OSBR Risk 1 IN: 2457
Test Results:: NEGATIVE
Weight: 145 [lb_av]

## 2022-02-15 LAB — AB SCR+ANTIBODY ID: Antibody Screen: POSITIVE — AB

## 2022-02-15 LAB — HCV INTERPRETATION

## 2022-02-19 ENCOUNTER — Encounter: Payer: Self-pay | Admitting: Family Medicine

## 2022-02-19 ENCOUNTER — Other Ambulatory Visit: Payer: Self-pay

## 2022-02-19 ENCOUNTER — Ambulatory Visit (INDEPENDENT_AMBULATORY_CARE_PROVIDER_SITE_OTHER): Payer: Medicaid Other | Admitting: Family Medicine

## 2022-02-19 VITALS — BP 111/68 | HR 84 | Wt 148.4 lb

## 2022-02-19 DIAGNOSIS — Z6791 Unspecified blood type, Rh negative: Secondary | ICD-10-CM

## 2022-02-19 DIAGNOSIS — O26899 Other specified pregnancy related conditions, unspecified trimester: Secondary | ICD-10-CM

## 2022-02-19 DIAGNOSIS — F419 Anxiety disorder, unspecified: Secondary | ICD-10-CM | POA: Insufficient documentation

## 2022-02-19 DIAGNOSIS — J302 Other seasonal allergic rhinitis: Secondary | ICD-10-CM | POA: Insufficient documentation

## 2022-02-19 DIAGNOSIS — R030 Elevated blood-pressure reading, without diagnosis of hypertension: Secondary | ICD-10-CM | POA: Insufficient documentation

## 2022-02-19 DIAGNOSIS — F329 Major depressive disorder, single episode, unspecified: Secondary | ICD-10-CM | POA: Insufficient documentation

## 2022-02-19 DIAGNOSIS — Z3402 Encounter for supervision of normal first pregnancy, second trimester: Secondary | ICD-10-CM

## 2022-02-19 DIAGNOSIS — E611 Iron deficiency: Secondary | ICD-10-CM

## 2022-02-19 MED ORDER — FERROUS SULFATE 325 (65 FE) MG PO TABS: 325.0000 mg | ORAL_TABLET | ORAL | 1 refills | Status: DC

## 2022-02-19 NOTE — Progress Notes (Signed)
? ?  PRENATAL VISIT NOTE ? ?Subjective:  ?Deanna Campbell is a 21 y.o. G1P0 at [redacted]w[redacted]d being seen today for ongoing prenatal care.  She is currently monitored for the following issues for this low-risk pregnancy and has Rh negative state in antepartum period; Encounter for supervision of normal pregnancy; Late prenatal care; Elevated blood-pressure reading without diagnosis of hypertension; Seasonal allergic rhinitis; Anxiety; and Major depressive disorder, single episode, unspecified on their problem list. ? ?Patient reports backache and abdominal pain .  Contractions: Irritability. Vag. Bleeding: None.  Movement: Present. Denies leaking of fluid.  ? ?The following portions of the patient's history were reviewed and updated as appropriate: allergies, current medications, past family history, past medical history, past social history, past surgical history and problem list.  ? ?Objective:  ? ?Vitals:  ? 02/19/22 1401  ?BP: 111/68  ?Pulse: 84  ?Weight: 148 lb 6.4 oz (67.3 kg)  ? ? ?Fetal Status: Fetal Heart Rate (bpm): 153   Movement: Present    ? ?General:  Alert, oriented and cooperative. Patient is in no acute distress.  ?Skin: Skin is warm and dry. No rash noted.   ?Cardiovascular: Normal heart rate noted  ?Respiratory: Normal respiratory effort, no problems with respiration noted  ?Abdomen: Soft, gravid, appropriate for gestational age.  Pain/Pressure: Present     ?Pelvic: Cervical exam performed in the presence of a chaperone Dilation: Closed Effacement (%): Thick Station: Ballotable  ?Extremities: Normal range of motion.     ?Mental Status: Normal mood and affect. Normal behavior. Normal judgment and thought content.  ? ?Assessment and Plan:  ?Pregnancy: G1P0 at [redacted]w[redacted]d ?1. Encounter for supervision of normal first pregnancy in second trimester ?Continue routine prenatal care. ?No evidence of PTL. Concern for round ligament pain. ?Heat + tylenol ? ?2. Rh negative state in antepartum period ?Will need rhogam later ? ?3.  Iron deficiency ?10.5 on new OB labs  ?Begin iron ?- ferrous sulfate (FERROUSUL) 325 (65 FE) MG tablet; Take 1 tablet (325 mg total) by mouth every other day.  Dispense: 60 tablet; Refill: 1 ? ?Preterm labor symptoms and general obstetric precautions including but not limited to vaginal bleeding, contractions, leaking of fluid and fetal movement were reviewed in detail with the patient. ?Please refer to After Visit Summary for other counseling recommendations.  ? ?No follow-ups on file. ? ?Future Appointments  ?Date Time Provider Department Center  ?03/13/2022  7:45 AM [facility] US4 [facility]  ?03/13/2022  9:35 AM [place] Bing, MD [facility]  ?04/10/2022 10:55 AM Anyanwu, Jethro Bastos, MD [facility]  ?04/24/2022 10:55 AM Anyanwu, Jethro Bastos, MD [facility]  ? ? ?Reva Bores, MD ? ?

## 2022-02-24 ENCOUNTER — Telehealth: Payer: Self-pay | Admitting: *Deleted

## 2022-02-24 ENCOUNTER — Encounter: Payer: Self-pay | Admitting: *Deleted

## 2022-02-24 LAB — ANEMIA PROFILE B
Basophils Absolute: 0.1 10*3/uL (ref 0.0–0.2)
Basos: 1 %
EOS (ABSOLUTE): 0.2 10*3/uL (ref 0.0–0.4)
Eos: 3 %
Ferritin: 27 ng/mL (ref 15–77)
Folate: 19.2 ng/mL (ref >3.0)
Hematocrit: 31.7 % — ABNORMAL LOW (ref 34.0–46.6)
Hemoglobin: 10.5 g/dL — ABNORMAL LOW (ref 11.1–15.9)
Immature Grans (Abs): 0 10*3/uL (ref 0.0–0.1)
Immature Granulocytes: 0 %
Iron Saturation: 36 % (ref 15–55)
Iron: 118 ug/dL (ref 27–159)
Lymphocytes Absolute: 1.2 10*3/uL (ref 0.7–3.1)
Lymphs: 16 %
MCH: 31.4 pg (ref 26.6–33.0)
MCHC: 33.1 g/dL (ref 31.5–35.7)
MCV: 95 fL (ref 79–97)
Monocytes Absolute: 0.6 10*3/uL (ref 0.1–0.9)
Monocytes: 8 %
Neutrophils Absolute: 5.3 10*3/uL (ref 1.4–7.0)
Neutrophils: 72 %
Platelets: 275 10*3/uL (ref 150–450)
RBC: 3.34 x10E6/uL — ABNORMAL LOW (ref 3.77–5.28)
RDW: 13.8 % (ref 11.7–15.4)
Total Iron Binding Capacity: 332 ug/dL (ref 250–450)
UIBC: 214 ug/dL (ref 131–425)
Vitamin B-12: 187 pg/mL — ABNORMAL LOW (ref 232–1245)
WBC: 7.3 10*3/uL (ref 3.4–10.8)

## 2022-02-24 LAB — SPECIMEN STATUS REPORT

## 2022-02-24 NOTE — Telephone Encounter (Signed)
Pt informed of Panorama results °

## 2022-02-25 ENCOUNTER — Other Ambulatory Visit: Payer: Self-pay | Admitting: *Deleted

## 2022-02-25 MED ORDER — VITAMIN B-12 1000 MCG PO TABS: 1000.0000 ug | ORAL_TABLET | Freq: Every day | ORAL | 1 refills | Status: DC

## 2022-02-26 ENCOUNTER — Encounter: Payer: Self-pay | Admitting: *Deleted

## 2022-03-13 ENCOUNTER — Ambulatory Visit: Payer: Medicaid Other | Attending: Obstetrics and Gynecology

## 2022-03-13 ENCOUNTER — Ambulatory Visit (INDEPENDENT_AMBULATORY_CARE_PROVIDER_SITE_OTHER): Payer: Medicaid Other | Admitting: Obstetrics and Gynecology

## 2022-03-13 ENCOUNTER — Other Ambulatory Visit: Payer: Self-pay | Admitting: *Deleted

## 2022-03-13 VITALS — BP 121/76 | HR 78 | Wt 153.0 lb

## 2022-03-13 DIAGNOSIS — O219 Vomiting of pregnancy, unspecified: Secondary | ICD-10-CM

## 2022-03-13 DIAGNOSIS — Z6791 Unspecified blood type, Rh negative: Secondary | ICD-10-CM

## 2022-03-13 DIAGNOSIS — Z3A24 24 weeks gestation of pregnancy: Secondary | ICD-10-CM

## 2022-03-13 DIAGNOSIS — Z362 Encounter for other antenatal screening follow-up: Secondary | ICD-10-CM

## 2022-03-13 DIAGNOSIS — Z363 Encounter for antenatal screening for malformations: Secondary | ICD-10-CM | POA: Insufficient documentation

## 2022-03-13 DIAGNOSIS — Z3402 Encounter for supervision of normal first pregnancy, second trimester: Secondary | ICD-10-CM | POA: Diagnosis not present

## 2022-03-13 DIAGNOSIS — O26899 Other specified pregnancy related conditions, unspecified trimester: Secondary | ICD-10-CM

## 2022-03-13 MED ORDER — ONDANSETRON HCL 4 MG PO TABS
4.0000 mg | ORAL_TABLET | Freq: Three times a day (TID) | ORAL | 1 refills | Status: DC | PRN
Start: 2022-03-13 — End: 2022-06-19

## 2022-03-13 NOTE — Progress Notes (Signed)
? ?  PRENATAL VISIT NOTE ? ?Subjective:  ?Deanna Campbell is a 21 y.o. G1P0 at [redacted]w[redacted]d being seen today for ongoing prenatal care.  She is currently monitored for the following issues for this low-risk pregnancy and has Rh negative state in antepartum period; Encounter for supervision of normal pregnancy; Late prenatal care; Elevated blood-pressure reading without diagnosis of hypertension; Seasonal allergic rhinitis; Anxiety; Major depressive disorder, single episode, unspecified; and Nausea and vomiting during pregnancy on their problem list. ? ?Patient reports no complaints.  Contractions: Irritability. Vag. Bleeding: None.  Movement: Present. Denies leaking of fluid.  ? ?The following portions of the patient's history were reviewed and updated as appropriate: allergies, current medications, past family history, past medical history, past social history, past surgical history and problem list.  ? ?Objective:  ? ?Vitals:  ? 03/13/22 0941  ?BP: 121/76  ?Pulse: 78  ?Weight: 153 lb (69.4 kg)  ? ? ?Fetal Status: Fetal Heart Rate (bpm): 152   Movement: Present    ? ?General:  Alert, oriented and cooperative. Patient is in no acute distress.  ?Skin: Skin is warm and dry. No rash noted.   ?Cardiovascular: Normal heart rate noted  ?Respiratory: Normal respiratory effort, no problems with respiration noted  ?Abdomen: Soft, gravid, appropriate for gestational age.  Pain/Pressure: Absent     ?Pelvic: Cervical exam deferred        ?Extremities: Normal range of motion.  Edema: Trace  ?Mental Status: Normal mood and affect. Normal behavior. Normal judgment and thought content.  ? ?Assessment and Plan:  ?Pregnancy: G1P0 at [redacted]w[redacted]d ?1. [redacted] weeks gestation of pregnancy ?28wk labs, rhogam, gtt and repeat anemia profile next visit ? ?2. Rh negative state in antepartum period ?Rhogam, ab screen nv ? ?3. Nausea and vomiting during pregnancy ?Zofran refill today. Pt told to increase miralax to bid ? ?Preterm labor symptoms and general obstetric  precautions including but not limited to vaginal bleeding, contractions, leaking of fluid and fetal movement were reviewed in detail with the patient. ?Please refer to After Visit Summary for other counseling recommendations.  ? ?Return for in person or virtual, md or app, low risk ob. ? ?Future Appointments  ?Date Time Provider Department Center  ?04/10/2022  8:15 AM [facility] NURSE [facility]  ?04/10/2022  8:30 AM [facility] US3 [facility]  ?04/10/2022 10:55 AM Anyanwu, Jethro Bastos, MD [facility]  ?04/24/2022 10:55 AM Anyanwu, Jethro Bastos, MD [facility]  ? ? ? Bing, MD ? ?

## 2022-03-21 ENCOUNTER — Other Ambulatory Visit: Payer: Self-pay | Admitting: Obstetrics and Gynecology

## 2022-04-10 ENCOUNTER — Encounter: Payer: Self-pay | Admitting: *Deleted

## 2022-04-10 ENCOUNTER — Ambulatory Visit: Payer: Medicaid Other | Admitting: *Deleted

## 2022-04-10 ENCOUNTER — Ambulatory Visit (INDEPENDENT_AMBULATORY_CARE_PROVIDER_SITE_OTHER): Payer: Medicaid Other | Admitting: Obstetrics & Gynecology

## 2022-04-10 ENCOUNTER — Encounter: Payer: Self-pay | Admitting: Obstetrics & Gynecology

## 2022-04-10 ENCOUNTER — Ambulatory Visit: Payer: Medicaid Other | Attending: Obstetrics and Gynecology

## 2022-04-10 VITALS — BP 108/68 | HR 94 | Wt 154.0 lb

## 2022-04-10 VITALS — BP 112/65 | HR 75

## 2022-04-10 DIAGNOSIS — Z3A28 28 weeks gestation of pregnancy: Secondary | ICD-10-CM | POA: Diagnosis not present

## 2022-04-10 DIAGNOSIS — Z362 Encounter for other antenatal screening follow-up: Secondary | ICD-10-CM

## 2022-04-10 DIAGNOSIS — Z23 Encounter for immunization: Secondary | ICD-10-CM

## 2022-04-10 DIAGNOSIS — O2343 Unspecified infection of urinary tract in pregnancy, third trimester: Secondary | ICD-10-CM

## 2022-04-10 DIAGNOSIS — Z3403 Encounter for supervision of normal first pregnancy, third trimester: Secondary | ICD-10-CM

## 2022-04-10 DIAGNOSIS — O359XX0 Maternal care for (suspected) fetal abnormality and damage, unspecified, not applicable or unspecified: Secondary | ICD-10-CM | POA: Diagnosis not present

## 2022-04-10 DIAGNOSIS — Z6791 Unspecified blood type, Rh negative: Secondary | ICD-10-CM

## 2022-04-10 DIAGNOSIS — O26899 Other specified pregnancy related conditions, unspecified trimester: Secondary | ICD-10-CM

## 2022-04-10 DIAGNOSIS — K649 Unspecified hemorrhoids: Secondary | ICD-10-CM

## 2022-04-10 LAB — POCT URINALYSIS DIPSTICK
Glucose, UA: NEGATIVE
Nitrite, UA: POSITIVE
Protein, UA: POSITIVE — AB
Spec Grav, UA: 1.02 (ref 1.010–1.025)
Urobilinogen, UA: 0.2 E.U./dL
pH, UA: 7 (ref 5.0–8.0)

## 2022-04-10 MED ORDER — CEFADROXIL 500 MG PO CAPS: 500.0000 mg | ORAL_CAPSULE | Freq: Two times a day (BID) | ORAL | 0 refills | Status: DC

## 2022-04-10 MED ORDER — HYDROCORTISONE (PERIANAL) 2.5 % EX CREA: TOPICAL_CREAM | Freq: Two times a day (BID) | CUTANEOUS | 2 refills | Status: DC

## 2022-04-10 NOTE — Patient Instructions (Signed)
Return to office for any scheduled appointments. Call the office or go to the MAU at [facility] at  if: You begin to have strong, frequent contractions Your water breaks.  Sometimes it is a big gush of fluid, sometimes it is just a trickle that keeps getting your underwear wet or running down your legs You have vaginal bleeding.  It is normal to have a small amount of spotting if your cervix was checked.  You do not feel your baby moving like normal.  If you do not, get something to eat and drink and lay down and focus on feeling your baby move.   If your baby is still not moving like normal, you should call the office or go to MAU. Any other obstetric concerns.  

## 2022-04-10 NOTE — Progress Notes (Signed)
\ ? ?PRENATAL VISIT NOTE ? ?Subjective:  ?Deanna Campbell is a 21 y.o. G1P0 at [redacted]w[redacted]d being seen today for ongoing prenatal care.  She is currently monitored for the following issues for this low-risk pregnancy and has Rh negative state in antepartum period; Encounter for supervision of normal pregnancy; Late prenatal care; Elevated blood-pressure reading without diagnosis of hypertension; Seasonal allergic rhinitis; Anxiety; Major depressive disorder, single episode, unspecified; Nausea and vomiting during pregnancy; and UTI (urinary tract infection) during pregnancy, third trimester on their problem list. ? ?Patient reports  increased urinary urgency and pain with urination for a few days, desires evaluation. No fevers or back pain . Needs medication for hemorrhoids.  Contractions: Irritability. Vag. Bleeding: None.  Movement: Present. Denies leaking of fluid.  ? ?The following portions of the patient's history were reviewed and updated as appropriate: allergies, current medications, past family history, past medical history, past social history, past surgical history and problem list.  ? ?Objective:  ? ?Vitals:  ? 04/10/22 1124  ?BP: 108/68  ?Pulse: 94  ?Weight: 154 lb (69.9 kg)  ? ? ?Fetal Status: Fetal Heart Rate (bpm): 146 Fundal Height: 27 cm Movement: Present    ? ?General:  Alert, oriented and cooperative. Patient is in no acute distress.  ?Skin: Skin is warm and dry. No rash noted.   ?Cardiovascular: Normal heart rate noted  ?Respiratory: Normal respiratory effort, no problems with respiration noted  ?Abdomen: Soft, gravid, appropriate for gestational age.  Pain/Pressure: Present     ?Pelvic: Cervical exam deferred        ?Extremities: Normal range of motion.  Edema: Trace  ?Mental Status: Normal mood and affect. Normal behavior. Normal judgment and thought content.  ? ?Results for orders placed or performed in visit on 04/10/22 (from the past 24 hour(s))  ?POCT Urinalysis Dipstick     Status: Abnormal  ?  Collection Time: 04/10/22 11:39 AM  ?Result Value Ref Range  ? Color, UA dark yellow   ? Clarity, UA cloudy   ? Glucose, UA Negative Negative  ? Bilirubin, UA    ? Ketones, UA    ? Spec Grav, UA 1.020 1.010 - 1.025  ? Blood, UA Moderate   ? pH, UA 7.0 5.0 - 8.0  ? Protein, UA Positive (A) Negative  ? Urobilinogen, UA 0.2 0.2 or 1.0 E.U./dL  ? Nitrite, UA Positive   ? Leukocytes, UA Large (3+) (A) Negative  ? Appearance    ? Odor Foul   ? ? ?Assessment and Plan:  ?Pregnancy: G1P0 at [redacted]w[redacted]d ?1. UTI (urinary tract infection) during pregnancy, third trimester ?Cefadroxil given for UTI, will also follow up urine culture and sensitivities.  ?- POCT Urinalysis Dipstick ?- Culture, OB Urine ?- cefadroxil (DURICEF) 500 MG capsule; Take 1 capsule (500 mg total) by mouth 2 (two) times daily.  Dispense: 14 capsule; Refill: 0 ? ?2. Need for Tdap vaccination ?- Tdap vaccine greater than or equal to 7yo IM given today. ? ?3. Rh negative state in antepartum period ?Will get Rhogam during upcoming lab visit. ?- Antibody screen; Future ? ?4. Hemorrhoids, unspecified hemorrhoid type ?Anusol prescribed. ?- hydrocortisone (ANUSOL-HC) 2.5 % rectal cream; Place rectally 2 (two) times daily.  Dispense: 30 g; Refill: 2 ? ?5. [redacted] weeks gestation of pregnancy ?6. Encounter for supervision of normal first pregnancy in third trimester ?She will get third trimester labs soon, unable to get them done today due to scheduling error on our part. I apologized to the patient, lab appointment scheduled  04/17/2022. She knows she has to be fasting for the labs, will get Rhogam then. ?- Glucose Tolerance, 2 Hours w/1 Hour; Future ?- HIV Antibody (routine testing w rflx); Future ?- RPR; Future ?- Anemia Profile B; Future ?Preterm labor symptoms and general obstetric precautions including but not limited to vaginal bleeding, contractions, leaking of fluid and fetal movement were reviewed in detail with the patient. ?Please refer to After Visit Summary for  other counseling recommendations.  ? ?Return in about 2 weeks (around 04/24/2022) for OFFICE OB VISIT (MD only). ? ?Future Appointments  ?Date Time Provider Department Center  ?04/17/2022  8:00 AM [facility] LAB [facility]  ?04/24/2022 10:55 AM [place] Chui, Jethro Bastos, MD [facility]  ?05/08/2022  8:35 AM [place] Bing, MD [facility]  ?05/22/2022  8:35 AM [place] Bing, MD [facility]  ?06/05/2022 10:55 AM [place] Bing, MD [facility]  ?06/11/2022 10:55 AM Reva Bores, MD [facility]  ?06/19/2022 10:55 AM [place] Bing, MD [facility]  ?06/25/2022 10:15 AM Reva Bores, MD [facility]  ? ? ?Jaynie Collins, MD ? ? ? ?

## 2022-04-11 ENCOUNTER — Telehealth: Payer: Self-pay

## 2022-04-11 NOTE — Telephone Encounter (Signed)
TC to pt regarding Triage nurse call.  ?Pt c/o UTI ,back pain and blood in urine yesterday.Marland Kitchen ?Pt advised to continue antibiotics, and monitor sx's today, if still bleeding, pain increases,fever pt advised to go to MAU.  ?Pt stated she is doing much better today so far.  ?Pt agreeable to my advice and voiced understanding.  ? ? ?

## 2022-04-13 LAB — URINE CULTURE, OB REFLEX

## 2022-04-13 LAB — CULTURE, OB URINE

## 2022-04-17 ENCOUNTER — Other Ambulatory Visit (INDEPENDENT_AMBULATORY_CARE_PROVIDER_SITE_OTHER): Payer: Medicaid Other

## 2022-04-17 DIAGNOSIS — O360930 Maternal care for other rhesus isoimmunization, third trimester, not applicable or unspecified: Secondary | ICD-10-CM | POA: Diagnosis not present

## 2022-04-17 DIAGNOSIS — Z3403 Encounter for supervision of normal first pregnancy, third trimester: Secondary | ICD-10-CM

## 2022-04-17 DIAGNOSIS — Z3A29 29 weeks gestation of pregnancy: Secondary | ICD-10-CM

## 2022-04-17 DIAGNOSIS — Z6791 Unspecified blood type, Rh negative: Secondary | ICD-10-CM

## 2022-04-17 MED ORDER — RHO D IMMUNE GLOBULIN 1500 UNIT/2ML IJ SOSY
300.0000 ug | PREFILLED_SYRINGE | Freq: Once | INTRAMUSCULAR | Status: AC
  Administered 2022-04-17: 300 ug via INTRAMUSCULAR

## 2022-04-17 NOTE — Progress Notes (Unsigned)
Rhogam given today

## 2022-04-18 LAB — ANEMIA PROFILE B
Basophils Absolute: 0.1 10*3/uL (ref 0.0–0.2)
Basos: 1 %
EOS (ABSOLUTE): 0.3 10*3/uL (ref 0.0–0.4)
Eos: 4 %
Ferritin: 26 ng/mL (ref 15–77)
Folate: 12 ng/mL (ref >3.0)
Hematocrit: 33.7 % — ABNORMAL LOW (ref 34.0–46.6)
Hemoglobin: 11.3 g/dL (ref 11.1–15.9)
Immature Grans (Abs): 0 10*3/uL (ref 0.0–0.1)
Immature Granulocytes: 0 %
Iron Saturation: 19 % (ref 15–55)
Iron: 73 ug/dL (ref 27–159)
Lymphocytes Absolute: 1.3 10*3/uL (ref 0.7–3.1)
Lymphs: 19 %
MCH: 32.4 pg (ref 26.6–33.0)
MCHC: 33.5 g/dL (ref 31.5–35.7)
MCV: 97 fL (ref 79–97)
Monocytes Absolute: 0.4 10*3/uL (ref 0.1–0.9)
Monocytes: 5 %
Neutrophils Absolute: 4.8 10*3/uL (ref 1.4–7.0)
Neutrophils: 71 %
Platelets: 226 10*3/uL (ref 150–450)
RBC: 3.49 x10E6/uL — ABNORMAL LOW (ref 3.77–5.28)
RDW: 12.9 % (ref 11.7–15.4)
Retic Ct Pct: 2.3 % (ref 0.6–2.6)
Total Iron Binding Capacity: 394 ug/dL (ref 250–450)
UIBC: 321 ug/dL (ref 131–425)
Vitamin B-12: 228 pg/mL — ABNORMAL LOW (ref 232–1245)
WBC: 6.8 10*3/uL (ref 3.4–10.8)

## 2022-04-18 LAB — ANTIBODY SCREEN: Antibody Screen: NEGATIVE

## 2022-04-18 LAB — GLUCOSE TOLERANCE, 2 HOURS W/ 1HR
Glucose, 1 hour: 110 mg/dL (ref 70–179)
Glucose, 2 hour: 61 mg/dL — ABNORMAL LOW (ref 70–152)
Glucose, Fasting: 75 mg/dL (ref 70–91)

## 2022-04-18 LAB — RPR: RPR Ser Ql: NONREACTIVE

## 2022-04-18 LAB — HIV ANTIBODY (ROUTINE TESTING W REFLEX): HIV Screen 4th Generation wRfx: NONREACTIVE

## 2022-04-21 ENCOUNTER — Other Ambulatory Visit: Payer: Medicaid Other

## 2022-04-22 ENCOUNTER — Encounter (HOSPITAL_COMMUNITY): Payer: Self-pay | Admitting: Obstetrics and Gynecology

## 2022-04-22 ENCOUNTER — Inpatient Hospital Stay (HOSPITAL_COMMUNITY)
Admission: AD | Admit: 2022-04-22 | Discharge: 2022-04-22 | Disposition: A | Discharge status: Home / Self Care | Payer: Medicaid Other | Attending: Obstetrics and Gynecology | Admitting: Obstetrics and Gynecology

## 2022-04-22 DIAGNOSIS — O26893 Other specified pregnancy related conditions, third trimester: Secondary | ICD-10-CM | POA: Insufficient documentation

## 2022-04-22 DIAGNOSIS — Z3689 Encounter for other specified antenatal screening: Secondary | ICD-10-CM | POA: Insufficient documentation

## 2022-04-22 DIAGNOSIS — Z87440 Personal history of urinary (tract) infections: Secondary | ICD-10-CM

## 2022-04-22 DIAGNOSIS — M79603 Pain in arm, unspecified: Secondary | ICD-10-CM | POA: Diagnosis not present

## 2022-04-22 DIAGNOSIS — M7918 Myalgia, other site: Secondary | ICD-10-CM | POA: Insufficient documentation

## 2022-04-22 DIAGNOSIS — R319 Hematuria, unspecified: Secondary | ICD-10-CM

## 2022-04-22 DIAGNOSIS — Z3A29 29 weeks gestation of pregnancy: Secondary | ICD-10-CM | POA: Insufficient documentation

## 2022-04-22 DIAGNOSIS — R102 Pelvic and perineal pain: Secondary | ICD-10-CM | POA: Diagnosis not present

## 2022-04-22 LAB — COMPREHENSIVE METABOLIC PANEL
ALT: 14 U/L (ref 0–44)
AST: 17 U/L (ref 15–41)
Albumin: 2.9 g/dL — ABNORMAL LOW (ref 3.5–5.0)
Alkaline Phosphatase: 87 U/L (ref 38–126)
Anion gap: 7 (ref 5–15)
BUN: <5 mg/dL — ABNORMAL LOW (ref 6–20)
CO2: 22 mmol/L (ref 22–32)
Calcium: 8.8 mg/dL — ABNORMAL LOW (ref 8.9–10.3)
Chloride: 107 mmol/L (ref 98–111)
Creatinine, Ser: 0.51 mg/dL (ref 0.44–1.00)
GFR, Estimated: >60 mL/min (ref >60)
Glucose, Bld: 135 mg/dL — ABNORMAL HIGH (ref 70–99)
Potassium: 3.3 mmol/L — ABNORMAL LOW (ref 3.5–5.1)
Sodium: 136 mmol/L (ref 135–145)
Total Bilirubin: 0.5 mg/dL (ref 0.3–1.2)
Total Protein: 6.2 g/dL — ABNORMAL LOW (ref 6.5–8.1)

## 2022-04-22 LAB — CBC WITH DIFFERENTIAL/PLATELET
Abs Immature Granulocytes: 0.02 10*3/uL (ref 0.00–0.07)
Basophils Absolute: 0 10*3/uL (ref 0.0–0.1)
Basophils Relative: 1 %
Eosinophils Absolute: 0.2 10*3/uL (ref 0.0–0.5)
Eosinophils Relative: 4 %
HCT: 31.3 % — ABNORMAL LOW (ref 36.0–46.0)
Hemoglobin: 10.5 g/dL — ABNORMAL LOW (ref 12.0–15.0)
Immature Granulocytes: 0 %
Lymphocytes Relative: 17 %
Lymphs Abs: 1.1 10*3/uL (ref 0.7–4.0)
MCH: 32.2 pg (ref 26.0–34.0)
MCHC: 33.5 g/dL (ref 30.0–36.0)
MCV: 96 fL (ref 80.0–100.0)
Monocytes Absolute: 0.4 10*3/uL (ref 0.1–1.0)
Monocytes Relative: 7 %
Neutro Abs: 4.6 10*3/uL (ref 1.7–7.7)
Neutrophils Relative %: 71 %
Platelets: 209 10*3/uL (ref 150–400)
RBC: 3.26 MIL/uL — ABNORMAL LOW (ref 3.87–5.11)
RDW: 13.1 % (ref 11.5–15.5)
WBC: 6.4 10*3/uL (ref 4.0–10.5)
nRBC: 0 % (ref 0.0–0.2)

## 2022-04-22 LAB — URINALYSIS, ROUTINE W REFLEX MICROSCOPIC
Bilirubin Urine: NEGATIVE
Glucose, UA: NEGATIVE mg/dL
Hgb urine dipstick: NEGATIVE
Ketones, ur: NEGATIVE mg/dL
Leukocytes,Ua: NEGATIVE
Nitrite: NEGATIVE
Protein, ur: NEGATIVE mg/dL
Specific Gravity, Urine: 1.013 (ref 1.005–1.030)
pH: 7 (ref 5.0–8.0)

## 2022-04-22 LAB — LACTIC ACID, PLASMA: Lactic Acid, Venous: 1.4 mmol/L (ref 0.5–1.9)

## 2022-04-22 MED ORDER — ACETAMINOPHEN 500 MG PO TABS
1000.0000 mg | ORAL_TABLET | Freq: Once | ORAL | Status: AC
  Administered 2022-04-22: 1000 mg via ORAL
  Filled 2022-04-22: qty 2

## 2022-04-22 MED ORDER — CYCLOBENZAPRINE HCL 10 MG PO TABS: 10.0000 mg | ORAL_TABLET | Freq: Two times a day (BID) | ORAL | 0 refills | Status: DC | PRN

## 2022-04-22 MED ORDER — CYCLOBENZAPRINE HCL 5 MG PO TABS
10.0000 mg | ORAL_TABLET | Freq: Once | ORAL | Status: AC
  Administered 2022-04-22: 10 mg via ORAL
  Filled 2022-04-22: qty 2

## 2022-04-22 NOTE — MAU Note (Signed)
...  Deanna Campbell is a 21 y.o. at [redacted]w[redacted]d here in MAU reporting: Bilateral lower abdominal pain, bilateral flank pain, and bilateral mid back pain since this past Friday, 5/19. She reports she was evaluated at Urgent Care this past Friday and was diagnosed with a UTI and was given a prescription for an ABX but states they called her on the 21st and stated she did not have a UTI but they did see "a lot" of blood in her urine. Endorses urinary frequency and burning with urination. Last IC this morning. Denies VB or LOF. +FM.   Pain score:  7/10 lower abdomen 7/10 flank pain 7/10 back   FHT: 132 initial external Lab orders placed from triage:  UA

## 2022-04-22 NOTE — MAU Provider Note (Signed)
History     CSN: [id number]  Arrival date and time: 04/22/22 1448   Event Date/Time   First Provider Initiated Contact with Patient 04/22/22 1528      Chief Complaint  Patient presents with   Arm Pain   Abdominal Pain   HPI Deanna Campbell is a 21 y.o. G1P0 at [redacted]w[redacted]d who presents to MAU with chief complaint of generalized abdominal pain. This is a new problem, onset Friday 04/15/2022. Patients pain is across her upper abdomen, her lower abdomen, and bilaterally along the sides of her torso. Pain score is 7/10. She denies aggravating or alleviating factors. She has not taken medication for this complaint.  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 She was prescribed medication in the office in early May. She presented to Urgent Care a few days ago and states she was diagnosed with another UTI, started on another medication, then received a followup phone call which said she did not have a UTI but did have "all kinds of blood in my urine". Patient endorses recurrent vaginal pain when voiding. She has not visualized hematuria. She endorses bilateral flank pain. She denies fever, abdominal tenderness, frequency.  Arm Pain Patient reports receiving her TDAP vaccine in her left deltoid about two weeks ago. Since that time she has experienced pain at the site of the injection. She initially management her pain with a cold compress but has not recently tried any medication of other treatments for this complaint.  Patient receives prenatal care with [facility] and her next appointment is 04/24/22.  OB History     Gravida  1   Para      Term      Preterm      AB      Living         SAB      IAB      Ectopic      Multiple      Live Births              Past Medical History:  Diagnosis Date   Anxiety    Depression     Past Surgical History:  Procedure Laterality Date   NO PAST SURGERIES      Family History  Problem Relation Age of Onset   Asthma Brother      Social History   Tobacco Use   Smoking status: Never   Smokeless tobacco: Never  Vaping Use   Vaping Use: Never used  Substance Use Topics   Alcohol use: Never   Drug use: Never    Allergies: No Known Allergies  Medications Prior to Admission  Medication Sig Dispense Refill Last Dose   diphenhydrAMINE (BENADRYL) 25 mg capsule Take 25 mg by mouth every 6 (six) hours as needed.   Past Month   promethazine (PHENERGAN) 12.5 MG tablet Take 12.5 mg by mouth every 6 (six) hours as needed for nausea or vomiting.   04/22/2022   cefadroxil (DURICEF) 500 MG capsule Take 1 capsule (500 mg total) by mouth 2 (two) times daily. 14 capsule 0    Doxylamine-Pyridoxine 10-10 MG TBEC Take 2 tablets at bedtime with pyridoxine for moderate nausea/vomiting   04/20/2022   ferrous sulfate (FERROUSUL) 325 (65 FE) MG tablet Take 1 tablet (325 mg total) by mouth every other day. 60 tablet 1 04/20/2022   hydrocortisone (ANUSOL-HC) 2.5 % rectal cream Place rectally 2 (two) times daily. 30 g 2    ondansetron (ZOFRAN) 4 MG tablet Take  1 tablet (4 mg total) by mouth every 8 (eight) hours as needed for nausea or vomiting. 30 tablet 1    polyethylene glycol powder (GLYCOLAX/MIRALAX) 17 GM/SCOOP powder Take 17 g by mouth daily as needed. 510 g 1    Prenatal Vit-Fe Fumarate-FA (MULTIVITAMIN-PRENATAL) 27-0.8 MG TABS tablet Take 1 tablet by mouth daily at 12 noon.   04/20/2022   vitamin B-12 (CYANOCOBALAMIN) 1000 MCG tablet Take 1 tablet (1,000 mcg total) by mouth daily. Take 1 tablet daily for 60 days 30 tablet 1 04/20/2022   vitamin B-6 (PYRIDOXINE) 25 MG tablet Take 1/2 to 1 tablet for mild nausea/vomiting   04/20/2022    Review of Systems  Constitutional:  Negative for fever.  Gastrointestinal:  Positive for abdominal pain.  Genitourinary:  Positive for dysuria, flank pain and hematuria. Negative for difficulty urinating, frequency and vaginal bleeding.       Hematuria per Urgent Care, not witnessed by patient   Musculoskeletal:  Negative for back pain.  All other systems reviewed and are negative. Physical Exam   Blood pressure 109/67, pulse (!) 105, temperature 98.6 F (37 C), temperature source Oral, resp. rate 16, height 5\' 7"  (1.702 m), weight 71.8 kg, last menstrual period 09/21/2021, SpO2 100 %.  Physical Exam Vitals and nursing note reviewed. Exam conducted with a chaperone present.  Constitutional:      Appearance: She is well-developed. She is not ill-appearing.  Cardiovascular:     Rate and Rhythm: Normal rate and regular rhythm.  Pulmonary:     Effort: Pulmonary effort is normal.     Breath sounds: Normal breath sounds.  Abdominal:     Palpations: Abdomen is soft.     Tenderness: There is no abdominal tenderness. There is no right CVA tenderness or left CVA tenderness.     Comments: Gravid  Genitourinary:    Comments: Closed/thick/posterior on digital exam Skin:    General: Skin is warm.     Capillary Refill: Capillary refill takes less than 2 seconds.     Comments: Left arm without lesion of any kind, unable to identify injection site, no TTP  Neurological:     Mental Status: She is alert and oriented to person, place, and time.    MAU Course  Procedures  MDM  --Reactive tracing: baseline 145, mod var, +accels, no decels --Toco: quiet --Pertinent negatives: Patient is negative for fever, CVAT, elevated WBCs, hematuria (in MAU UA), abnormal UA --Lengthy discussion with patient and her mom regarding possible differential diagnoses including kidney stone, musculoskeletal pain --1620: Patient sleeping soundly, woke when CNM entered room, reports pain score 5/10.  Orders Placed This Encounter  Procedures   09/23/2021 RENAL   Urinalysis, Routine w reflex microscopic Urine, Clean Catch   CBC with Differential/Platelet   Comprehensive metabolic panel   Lactic acid, plasma   Discharge patient   Patient Vitals for the past 24 hrs:  BP Temp Temp src Pulse Resp SpO2 Height  Weight  04/22/22 1633 110/61 -- -- 78 -- -- -- --  04/22/22 1620 -- -- -- -- -- 99 % -- --  04/22/22 1615 -- -- -- -- -- 99 % -- --  04/22/22 1610 -- -- -- -- -- 99 % -- --  04/22/22 1605 -- -- -- -- -- 99 % -- --  04/22/22 1600 -- -- -- -- -- 98 % -- --  04/22/22 1555 -- -- -- -- -- 98 % -- --  04/22/22 1550 -- -- -- -- -- 99 % -- --  04/22/22 1545 -- -- -- -- -- 99 % -- --  04/22/22 1540 -- -- -- -- -- 99 % -- --  04/22/22 1535 -- -- -- -- -- 100 % -- --  04/22/22 1530 -- -- -- -- -- 98 % -- --  04/22/22 1525 -- -- -- -- -- 99 % -- --  04/22/22 1520 -- -- -- -- -- 99 % -- --  04/22/22 1504 109/67 98.6 F (37 C) Oral (!) 105 16 100 % 5\' 7"  (1.702 m) 71.8 kg   Assessment and Plan  --[age] y.o. G1P0 at 311w6d  --Reactive tracing, closed cervix --Clean UA, all sepsis labs WNL --Musculoskeletal pain in third trimester --Patient sleeping after PO medication --Order placed for outpatient renal scan, [facility] with [facility] --No atypical findings to justify left deltoid pain --Discharge home in stable condition  F/U: Next OB appointment is 04/24/2022  Calvert CantorSamantha C Stephania Macfarlane, MSA, MSN, CNM 04/22/2022, 7:04 PM

## 2022-04-23 ENCOUNTER — Telehealth: Payer: Self-pay

## 2022-04-23 NOTE — Telephone Encounter (Signed)
Notified pt of her renal US scheduled at [facility]. Provided details about full bladder and early arrival. Pt understood.

## 2022-04-24 ENCOUNTER — Ambulatory Visit (INDEPENDENT_AMBULATORY_CARE_PROVIDER_SITE_OTHER): Payer: Medicaid Other | Admitting: Obstetrics & Gynecology

## 2022-04-24 VITALS — BP 109/68 | HR 83 | Wt 160.0 lb

## 2022-04-24 DIAGNOSIS — Z3A30 30 weeks gestation of pregnancy: Secondary | ICD-10-CM

## 2022-04-24 DIAGNOSIS — R12 Heartburn: Secondary | ICD-10-CM

## 2022-04-24 DIAGNOSIS — Z3403 Encounter for supervision of normal first pregnancy, third trimester: Secondary | ICD-10-CM

## 2022-04-24 DIAGNOSIS — O219 Vomiting of pregnancy, unspecified: Secondary | ICD-10-CM

## 2022-04-24 DIAGNOSIS — O26893 Other specified pregnancy related conditions, third trimester: Secondary | ICD-10-CM

## 2022-04-24 MED ORDER — PROMETHAZINE HCL 12.5 MG PO TABS: 12.5000 mg | ORAL_TABLET | Freq: Four times a day (QID) | ORAL | 4 refills | Status: DC | PRN

## 2022-04-24 MED ORDER — PANTOPRAZOLE SODIUM 40 MG PO TBEC: 40.0000 mg | DELAYED_RELEASE_TABLET | Freq: Every day | ORAL | 2 refills | Status: DC

## 2022-04-24 NOTE — Progress Notes (Signed)
PRENATAL VISIT NOTE  Subjective:  Deanna Campbell is a 21 y.o. G1P0 at [redacted]w[redacted]d being seen today for ongoing prenatal care.  She is currently monitored for the following issues for this low-risk pregnancy and has Rh negative state in antepartum period; Encounter for supervision of normal pregnancy; Late prenatal care; Elevated blood-pressure reading without diagnosis of hypertension; Seasonal allergic rhinitis; Anxiety; Major depressive disorder, single episode, unspecified; Nausea and vomiting during pregnancy; and UTI (urinary tract infection) during pregnancy, third trimester on their problem list.  Patient reports heartburn and nausea and desired refill of her Promethazine.  Contractions: Not present. Vag. Bleeding: None.  Movement: Present. Denies leaking of fluid. Of note, was seen in MAU on 04/22/22 for lower abdominal pain, and negative evaluation but scheduled for renal ultrasound at [facility] on 05/01/22.  Denies any current abdominal or back pain or urinary symptoms.   The following portions of the patient's history were reviewed and updated as appropriate: allergies, current medications, past family history, past medical history, past social history, past surgical history and problem list.   Objective:   Vitals:   04/24/22 1049  BP: 109/68  Pulse: 83  Weight: 160 lb (72.6 kg)    Fetal Status: Fetal Heart Rate (bpm): 151 Fundal Height: 30 cm Movement: Present     General:  Alert, oriented and cooperative. Patient is in no acute distress.  Skin: Skin is warm and dry. No rash noted.   Cardiovascular: Normal heart rate noted  Respiratory: Normal respiratory effort, no problems with respiration noted  Abdomen: Soft, gravid, appropriate for gestational age.  Pain/Pressure: Absent     Pelvic: Cervical exam deferred        Extremities: Normal range of motion.  Edema: Trace  Mental Status: Normal mood and affect. Normal behavior. Normal judgment and thought content.   Assessment and Plan:   Pregnancy: G1P0 at [redacted]w[redacted]d 1. Nausea/vomiting in pregnancy 2. Heartburn during pregnancy in third trimester Phenergan refilled, Protonix prescribed. Will monitor effect. - promethazine (PHENERGAN) 12.5 MG tablet; Take 1 tablet (12.5 mg total) by mouth every 6 (six) hours as needed for nausea or vomiting.  Dispense: 30 tablet; Refill: 4 - pantoprazole (PROTONIX) 40 MG tablet; Take 1 tablet (40 mg total) by mouth daily.  Dispense: 30 tablet; Refill: 2  3. [redacted] weeks gestation of pregnancy 4. Encounter for supervision of normal first pregnancy in third trimester Reviewed normal third trimester labs with patient.  Patient reported she will not be able to make her 32 week appointment, she is getting married and will go out of town afterwards.  Unable to do virtual appointment. She was given a BP cuff, advised to take BP weekly and let us know if elevated.  Was told to the nearest ER with any concerning symptoms, can also send [place] MyChart message or call for any concerns.  Preterm labor symptoms and general obstetric precautions including but not limited to vaginal bleeding, contractions, leaking of fluid and fetal movement were reviewed in detail with the patient. Please refer to After Visit Summary for other counseling recommendations.   Return in about 2 weeks (around 05/08/2022) for OFFICE OB VISIT (MD or APP).  Future Appointments  Date Time Provider Department Center  05/01/2022  2:30 PM [facility]  05/22/2022  8:35 AM Hewlett Bing, MD [facility]  06/05/2022 10:55 AM Brilliant Bing, MD [facility]  06/11/2022 10:55 AM Reva Bores, MD [facility]  06/19/2022 10:55 AM  Bing, MD [facility]  06/25/2022 10:15  AM Reva Bores, MD [facility]    Jaynie Collins, MD

## 2022-04-24 NOTE — Patient Instructions (Signed)
Return to office for any scheduled appointments. Call the office or go to the MAU at [facility] if: You begin to have strong, frequent contractions Your water breaks.  Sometimes it is a big gush of fluid, sometimes it is just a trickle that keeps getting your underwear wet or running down your legs You have vaginal bleeding.  It is normal to have a small amount of spotting if your cervix was checked.  You do not feel your baby moving like normal.  If you do not, get something to eat and drink and lay down and focus on feeling your baby move.   If your baby is still not moving like normal, you should call the office or go to MAU. Any other obstetric concerns.  

## 2022-04-29 ENCOUNTER — Other Ambulatory Visit: Payer: Self-pay | Admitting: Family Medicine

## 2022-04-29 ENCOUNTER — Other Ambulatory Visit: Payer: Self-pay | Admitting: Obstetrics and Gynecology

## 2022-04-29 DIAGNOSIS — E611 Iron deficiency: Secondary | ICD-10-CM

## 2022-05-01 ENCOUNTER — Ambulatory Visit
Admission: RE | Admit: 2022-05-01 | Discharge: 2022-05-01 | Disposition: A | Discharge status: Home / Self Care | Payer: Medicaid Other | Source: Ambulatory Visit | Attending: Advanced Practice Midwife | Admitting: Advanced Practice Midwife

## 2022-05-01 DIAGNOSIS — R319 Hematuria, unspecified: Secondary | ICD-10-CM | POA: Diagnosis not present

## 2022-05-08 ENCOUNTER — Encounter: Payer: Medicaid Other | Admitting: Obstetrics and Gynecology

## 2022-05-09 ENCOUNTER — Encounter (HOSPITAL_COMMUNITY): Payer: Self-pay | Admitting: Obstetrics and Gynecology

## 2022-05-09 ENCOUNTER — Inpatient Hospital Stay (HOSPITAL_COMMUNITY)
Admission: AD | Admit: 2022-05-09 | Discharge: 2022-05-09 | Disposition: A | Discharge status: Home / Self Care | Payer: Medicaid Other | Attending: Obstetrics and Gynecology | Admitting: Obstetrics and Gynecology

## 2022-05-09 DIAGNOSIS — Z3A32 32 weeks gestation of pregnancy: Secondary | ICD-10-CM | POA: Insufficient documentation

## 2022-05-09 DIAGNOSIS — O26893 Other specified pregnancy related conditions, third trimester: Secondary | ICD-10-CM | POA: Diagnosis present

## 2022-05-09 DIAGNOSIS — M549 Dorsalgia, unspecified: Secondary | ICD-10-CM | POA: Diagnosis not present

## 2022-05-09 DIAGNOSIS — O99891 Other specified diseases and conditions complicating pregnancy: Secondary | ICD-10-CM | POA: Diagnosis not present

## 2022-05-09 LAB — URINALYSIS, ROUTINE W REFLEX MICROSCOPIC
Bilirubin Urine: NEGATIVE
Glucose, UA: NEGATIVE mg/dL
Hgb urine dipstick: NEGATIVE
Ketones, ur: NEGATIVE mg/dL
Leukocytes,Ua: NEGATIVE
Nitrite: NEGATIVE
Protein, ur: NEGATIVE mg/dL
Specific Gravity, Urine: 1.018 (ref 1.005–1.030)
pH: 7 (ref 5.0–8.0)

## 2022-05-09 NOTE — MAU Note (Signed)
.  Deanna Campbell is a 21 y.o. at [redacted]w[redacted]d here in MAU reporting pain in her sides and lower abdomen. Was seen in MAU 2 wks ago for the same pain and had kidney u/s at that time. Has results on My Chart and called for explanation of results. Person with whom the patient was talking asked pt if she was still having pain. When patient answered "Yes" she was told to return to MAU. Pain comes and goes in lower abd and has the urge to "pee" a lot. Has pressure when urinates but not really pain. Good FM. Denies LOF or VB  Onset of complaint: 2wks Pain score: 6 Vitals:   05/09/22 2026  BP: 113/77  Pulse: 88  Resp: 16  Temp: 98.3 F (36.8 C)  SpO2: 100%     FHT:130 Lab orders placed from triage:  u/a

## 2022-05-09 NOTE — MAU Provider Note (Signed)
History     [id number]  Arrival date and time: 05/09/22 2012    Chief Complaint  Patient presents with   Abdominal Pain     HPI Deanna Campbell is a 21 y.o. at [redacted]w[redacted]d who presents for abdominal, side, & back pain. Was seen in MAU a few weeks ago for this pain. Reports low back, bilateral side, & bilateral lower abdominal pain. Had an outpatient renal ultrasound last week to complete evaluation. Called the after hours number with her OB this evening to discuss results & was instructed to come to MAU. Denies any change in pain. Denies fever, vomiting, dysuria, hematuria, contractions, vaginal bleeding, or LOF. Reports good fetal movement. States she only came to review her ultrasound results.   OB History     Gravida  1   Para      Term      Preterm      AB      Living         SAB      IAB      Ectopic      Multiple      Live Births              Past Medical History:  Diagnosis Date   Anxiety    Depression     Past Surgical History:  Procedure Laterality Date   NO PAST SURGERIES      Family History  Problem Relation Age of Onset   Asthma Brother     No Known Allergies  No current facility-administered medications on file prior to encounter.   Current Outpatient Medications on File Prior to Encounter  Medication Sig Dispense Refill   cyclobenzaprine (FLEXERIL) 10 MG tablet Take 1 tablet (10 mg total) by mouth 2 (two) times daily as needed for muscle spasms. 20 tablet 0   diphenhydrAMINE (BENADRYL) 25 mg capsule Take 25 mg by mouth every 6 (six) hours as needed. (Patient not taking: Reported on 04/24/2022)     Doxylamine-Pyridoxine 10-10 MG TBEC Take 2 tablets at bedtime with pyridoxine for moderate nausea/vomiting     ferrous sulfate (FERROUSUL) 325 (65 FE) MG tablet Take 1 tablet (325 mg total) by mouth every other day. 60 tablet 1   hydrocortisone (ANUSOL-HC) 2.5 % rectal cream Place rectally 2 (two) times daily. 30 g 2   ondansetron (ZOFRAN) 4 MG  tablet Take 1 tablet (4 mg total) by mouth every 8 (eight) hours as needed for nausea or vomiting. 30 tablet 1   pantoprazole (PROTONIX) 40 MG tablet Take 1 tablet (40 mg total) by mouth daily. 30 tablet 2   polyethylene glycol powder (GLYCOLAX/MIRALAX) 17 GM/SCOOP powder Take 17 g by mouth daily as needed. 510 g 1   Prenatal Vit-Fe Fumarate-FA (MULTIVITAMIN-PRENATAL) 27-0.8 MG TABS tablet Take 1 tablet by mouth daily at 12 noon.     promethazine (PHENERGAN) 12.5 MG tablet Take 1 tablet (12.5 mg total) by mouth every 6 (six) hours as needed for nausea or vomiting. 30 tablet 4   vitamin B-12 (CYANOCOBALAMIN) 1000 MCG tablet Take 1 tablet (1,000 mcg total) by mouth daily. Take 1 tablet daily for 60 days 30 tablet 1   vitamin B-6 (PYRIDOXINE) 25 MG tablet Take 1/2 to 1 tablet for mild nausea/vomiting       ROS Pertinent positives and negative per HPI, all others reviewed and negative  Physical Exam   BP 114/65   Pulse 89   Temp 98.3 F (36.8 C)   Resp  16   Ht 5\' 7"  (1.702 m)   Wt 73.5 kg   LMP 09/21/2021   SpO2 98%   BMI 25.37 kg/m   Patient Vitals for the past 24 hrs:  BP Temp Pulse Resp SpO2 Height Weight  05/09/22 2130 114/65 -- 89 -- -- -- --  05/09/22 2045 120/76 -- 89 -- 98 % -- --  05/09/22 2026 113/77 98.3 F (36.8 C) 88 16 100 % 5\' 7"  (1.702 m) 73.5 kg    Physical Exam Vitals and nursing note reviewed.  Constitutional:      General: She is not in acute distress.    Appearance: She is well-developed.  HENT:     Head: Normocephalic and atraumatic.  Pulmonary:     Effort: Pulmonary effort is normal. No respiratory distress.  Abdominal:     Palpations: Abdomen is soft.     Tenderness: There is no abdominal tenderness.  Skin:    General: Skin is warm and dry.  Neurological:     Mental Status: She is alert.       FHT Baseline 130, moderate variability, 15x15 accels, no decels Toco: none Cat: 1  Labs Results for orders placed or performed during the  hospital encounter of 05/09/22 (from the past 24 hour(s))  Urinalysis, Routine w reflex microscopic Urine, Clean Catch     Status: Abnormal   Collection Time: 05/09/22  8:48 PM  Result Value Ref Range   Color, Urine YELLOW YELLOW   APPearance HAZY (A) CLEAR   Specific Gravity, Urine 1.018 1.005 - 1.030   pH 7.0 5.0 - 8.0   Glucose, UA NEGATIVE NEGATIVE mg/dL   Hgb urine dipstick NEGATIVE NEGATIVE   Bilirubin Urine NEGATIVE NEGATIVE   Ketones, ur NEGATIVE NEGATIVE mg/dL   Protein, ur NEGATIVE NEGATIVE mg/dL   Nitrite NEGATIVE NEGATIVE   Leukocytes,Ua NEGATIVE NEGATIVE    Imaging No results found.  MAU Course  Procedures Lab Orders         Culture, OB Urine         Urinalysis, Routine w reflex microscopic Urine, Clean Catch    No orders of the defined types were placed in this encounter.  Imaging Orders  No imaging studies ordered today    MDM Patient presents to review renal ultrasound results from last week. Per ultrasound, she has bilateral mild hydronephrosis, no evidence of stone.  U/a today is negative - urine culture sent for test of cure from UTI last month.  Patient with no new complaints.  Discussed maternity support belt.  Assessment and Plan   1. Back pain affecting pregnancy in third trimester   2. [redacted] weeks gestation of pregnancy    -encouraged to start wearing maternity support belt -urine culture pending -reviewed reasons to return to MAU   07/09/22, NP 05/09/22 9:36 PM

## 2022-05-09 NOTE — Discharge Instructions (Signed)
Recommend pregnancy support belt/maternity support belt.  One brand is called Prenatal Cradle You may find these on [employer].com, [email or web address], or [employer].com

## 2022-05-11 LAB — CULTURE, OB URINE: Special Requests: NORMAL

## 2022-05-22 ENCOUNTER — Ambulatory Visit (INDEPENDENT_AMBULATORY_CARE_PROVIDER_SITE_OTHER): Payer: Medicaid Other | Admitting: Obstetrics and Gynecology

## 2022-05-22 ENCOUNTER — Encounter: Payer: Self-pay | Admitting: Obstetrics and Gynecology

## 2022-05-22 VITALS — BP 106/73 | HR 81 | Wt 164.0 lb

## 2022-05-22 DIAGNOSIS — Z3403 Encounter for supervision of normal first pregnancy, third trimester: Secondary | ICD-10-CM

## 2022-05-22 DIAGNOSIS — Z3A34 34 weeks gestation of pregnancy: Secondary | ICD-10-CM

## 2022-05-22 DIAGNOSIS — O26899 Other specified pregnancy related conditions, unspecified trimester: Secondary | ICD-10-CM

## 2022-05-22 DIAGNOSIS — Z6791 Unspecified blood type, Rh negative: Secondary | ICD-10-CM

## 2022-05-22 NOTE — Progress Notes (Signed)
   PRENATAL VISIT NOTE  Subjective:  Deanna Campbell is a 21 y.o. G1P0 at [redacted]w[redacted]d being seen today for ongoing prenatal care.  She is currently monitored for the following issues for this low-risk pregnancy and has Rh negative state in antepartum period; Encounter for supervision of normal pregnancy; Late prenatal care; Elevated blood-pressure reading without diagnosis of hypertension; Seasonal allergic rhinitis; Anxiety; Major depressive disorder, single episode, unspecified; Nausea and vomiting during pregnancy; and UTI (urinary tract infection) during pregnancy, third trimester on their problem list.  Patient reports no complaints.  Contractions: Irritability. Vag. Bleeding: None.  Movement: Present. Denies leaking of fluid.   The following portions of the patient's history were reviewed and updated as appropriate: allergies, current medications, past family history, past medical history, past social history, past surgical history and problem list.   Objective:   Vitals:   05/22/22 0835  BP: 106/73  Pulse: 81  Weight: 164 lb (74.4 kg)    Fetal Status: Fetal Heart Rate (bpm): 158 Fundal Height: 33 cm Movement: Present  Presentation: Vertex  General:  Alert, oriented and cooperative. Patient is in no acute distress.  Skin: Skin is warm and dry. No rash noted.   Cardiovascular: Normal heart rate noted  Respiratory: Normal respiratory effort, no problems with respiration noted  Abdomen: Soft, gravid, appropriate for gestational age.  Pain/Pressure: Present     Pelvic: Cervical exam deferred        Extremities: Normal range of motion.  Edema: None  Mental Status: Normal mood and affect. Normal behavior. Normal judgment and thought content.   Assessment and Plan:  Pregnancy: G1P0 at [redacted]w[redacted]d 1. Encounter for supervision of normal first pregnancy in third trimester GBS next visit. Pt desiring immediate PP IUD (type unsure)  2. [redacted] weeks gestation of pregnancy  3. Rh negative state in  antepartum period Rhogam pp prn  Preterm labor symptoms and general obstetric precautions including but not limited to vaginal bleeding, contractions, leaking of fluid and fetal movement were reviewed in detail with the patient. Please refer to After Visit Summary for other counseling recommendations.   No follow-ups on file.  Future Appointments  Date Time Provider Department Center  06/05/2022 10:55 AM Crowley Bing, MD [facility]  06/11/2022 10:55 AM Reva Bores, MD [facility]  06/19/2022 10:55 AM [place] Bing, MD [facility]  06/25/2022 10:15 AM Reva Bores, MD [facility]    [place] Bing, MD

## 2022-05-22 NOTE — Progress Notes (Signed)
ROB [redacted]w[redacted]d  CC: Back pain and lower abdominal pain

## 2022-06-05 ENCOUNTER — Other Ambulatory Visit (HOSPITAL_COMMUNITY)
Admission: RE | Admit: 2022-06-05 | Discharge: 2022-06-05 | Disposition: A | Discharge status: Home / Self Care | Payer: Medicaid Other | Source: Ambulatory Visit | Attending: Obstetrics and Gynecology | Admitting: Obstetrics and Gynecology

## 2022-06-05 ENCOUNTER — Ambulatory Visit (INDEPENDENT_AMBULATORY_CARE_PROVIDER_SITE_OTHER): Payer: Medicaid Other | Admitting: Obstetrics and Gynecology

## 2022-06-05 DIAGNOSIS — Z3403 Encounter for supervision of normal first pregnancy, third trimester: Secondary | ICD-10-CM | POA: Insufficient documentation

## 2022-06-05 DIAGNOSIS — Z3A36 36 weeks gestation of pregnancy: Secondary | ICD-10-CM | POA: Diagnosis not present

## 2022-06-05 NOTE — Progress Notes (Signed)
   PRENATAL VISIT NOTE  Subjective:  Deanna Campbell is a 21 y.o. G1P0 at [redacted]w[redacted]d being seen today for ongoing prenatal care.  She is currently monitored for the following issues for this low-risk pregnancy and has Rh negative state in antepartum period; Encounter for supervision of normal pregnancy; Late prenatal care; Elevated blood-pressure reading without diagnosis of hypertension; Seasonal allergic rhinitis; Anxiety; Major depressive disorder, single episode, unspecified; Nausea and vomiting during pregnancy; and UTI (urinary tract infection) during pregnancy, third trimester on their problem list.  Patient reports no complaints.  Contractions: Irritability. Vag. Bleeding: None.  Movement: Present. Denies leaking of fluid.   The following portions of the patient's history were reviewed and updated as appropriate: allergies, current medications, past family history, past medical history, past social history, past surgical history and problem list.   Objective:   Vitals:   06/05/22 1041  BP: 110/73  Pulse: 83  Weight: 168 lb (76.2 kg)    Fetal Status: Fetal Heart Rate (bpm): 130 Fundal Height: 35 cm Movement: Present  Presentation: Vertex  General:  Alert, oriented and cooperative. Patient is in no acute distress.  Skin: Skin is warm and dry. No rash noted.   Cardiovascular: Normal heart rate noted  Respiratory: Normal respiratory effort, no problems with respiration noted  Abdomen: Soft, gravid, appropriate for gestational age.  Pain/Pressure: Present     Pelvic: Cervical exam performed in the presence of a chaperone Dilation: Fingertip Effacement (%): Thick Station: Ballotable  Extremities: Normal range of motion.  Edema: Trace  Mental Status: Normal mood and affect. Normal behavior. Normal judgment and thought content.   Assessment and Plan:  Pregnancy: G1P0 at [redacted]w[redacted]d 1. Encounter for supervision of normal first pregnancy in third trimester Routine care. Bedside u/s today shows  cephalic, af subjectively normal, normal fhr and incidental breathing noted - Strep Gp B NAA - Cervicovaginal ancillary only( [place])  Preterm labor symptoms and general obstetric precautions including but not limited to vaginal bleeding, contractions, leaking of fluid and fetal movement were reviewed in detail with the patient. Please refer to After Visit Summary for other counseling recommendations.   Return in about 1 day (around 06/06/2022) for low risk ob, in person, md or app.  Future Appointments  Date Time Provider Department Center  06/11/2022 10:55 AM Reva Bores, MD [facility]  06/19/2022 10:55 AM [place] Bing, MD [facility]  06/25/2022 10:15 AM Reva Bores, MD [facility]  07/03/2022 10:55 AM [place] Bing, MD [facility]    [place] Bing, MD

## 2022-06-05 NOTE — Progress Notes (Signed)
ROB [redacted]w[redacted]d  GBS today.  Pt would like cervix check today.   CC: None `

## 2022-06-06 LAB — CERVICOVAGINAL ANCILLARY ONLY
Chlamydia: NEGATIVE
Comment: NEGATIVE
Comment: NEGATIVE
Comment: NORMAL
Neisseria Gonorrhea: NEGATIVE
Trichomonas: NEGATIVE

## 2022-06-07 LAB — STREP GP B NAA: Strep Gp B NAA: NEGATIVE

## 2022-06-11 ENCOUNTER — Ambulatory Visit (INDEPENDENT_AMBULATORY_CARE_PROVIDER_SITE_OTHER): Payer: Medicaid Other | Admitting: Family Medicine

## 2022-06-11 VITALS — BP 113/73 | HR 83 | Wt 168.0 lb

## 2022-06-11 DIAGNOSIS — O0930 Supervision of pregnancy with insufficient antenatal care, unspecified trimester: Secondary | ICD-10-CM

## 2022-06-11 DIAGNOSIS — Z3403 Encounter for supervision of normal first pregnancy, third trimester: Secondary | ICD-10-CM

## 2022-06-11 DIAGNOSIS — Z6791 Unspecified blood type, Rh negative: Secondary | ICD-10-CM

## 2022-06-11 DIAGNOSIS — O26899 Other specified pregnancy related conditions, unspecified trimester: Secondary | ICD-10-CM

## 2022-06-11 NOTE — Progress Notes (Signed)
ROB 37w  Pt wants cervix check today.

## 2022-06-11 NOTE — Progress Notes (Signed)
    PRENATAL VISIT NOTE  Subjective:  Deanna Campbell is a 21 y.o. G1P0 at [redacted]w[redacted]d being seen today for ongoing prenatal care.  She is currently monitored for the following issues for this low-risk pregnancy and has Rh negative state in antepartum period; Encounter for supervision of normal pregnancy; Late prenatal care; Elevated blood-pressure reading without diagnosis of hypertension; Seasonal allergic rhinitis; Anxiety; Major depressive disorder, single episode, unspecified; Nausea and vomiting during pregnancy; and UTI (urinary tract infection) during pregnancy, third trimester on their problem list.  Patient reports no complaints.  Contractions: Irritability. Vag. Bleeding: None.  Movement: Present. Denies leaking of fluid.   The following portions of the patient's history were reviewed and updated as appropriate: allergies, current medications, past family history, past medical history, past social history, past surgical history and problem list.   Objective:   Vitals:   06/11/22 1037  BP: 113/73  Pulse: 83  Weight: 168 lb (76.2 kg)    Fetal Status: Fetal Heart Rate (bpm): 140 Fundal Height: 36 cm Movement: Present  Presentation: Vertex  General:  Alert, oriented and cooperative. Patient is in no acute distress.  Skin: Skin is warm and dry. No rash noted.   Cardiovascular: Normal heart rate noted  Respiratory: Normal respiratory effort, no problems with respiration noted  Abdomen: Soft, gravid, appropriate for gestational age.  Pain/Pressure: Present     Pelvic: Cervical exam performed in the presence of a chaperone Dilation: Fingertip Effacement (%): 90 Station: -2  Extremities: Normal range of motion.  Edema: Moderate pitting, indentation subsides rapidly  Mental Status: Normal mood and affect. Normal behavior. Normal judgment and thought content.   Assessment and Plan:  Pregnancy: G1P0 at [redacted]w[redacted]d 1. Encounter for supervision of normal first pregnancy in third trimester Continue  routine prenatal care.  2. Rh negative state in antepartum period S/p rhogam   Preterm labor symptoms and general obstetric precautions including but not limited to vaginal bleeding, contractions, leaking of fluid and fetal movement were reviewed in detail with the patient. Please refer to After Visit Summary for other counseling recommendations.   Return in 1 week (on 06/18/2022).  Future Appointments  Date Time Provider Department Center  06/19/2022 10:55 AM [place] Bing, MD [facility]  06/25/2022 10:15 AM Reva Bores, MD [facility]  07/03/2022 10:55 AM  Bing, MD [facility]    Reva Bores, MD

## 2022-06-12 ENCOUNTER — Encounter: Payer: Medicaid Other | Admitting: Advanced Practice Midwife

## 2022-06-19 ENCOUNTER — Encounter: Payer: Self-pay | Admitting: Obstetrics and Gynecology

## 2022-06-19 ENCOUNTER — Ambulatory Visit (INDEPENDENT_AMBULATORY_CARE_PROVIDER_SITE_OTHER): Payer: Medicaid Other | Admitting: Obstetrics and Gynecology

## 2022-06-19 DIAGNOSIS — Z3403 Encounter for supervision of normal first pregnancy, third trimester: Secondary | ICD-10-CM

## 2022-06-19 MED ORDER — ONDANSETRON HCL 4 MG PO TABS: 4.0000 mg | ORAL_TABLET | Freq: Three times a day (TID) | ORAL | 1 refills | Status: DC | PRN

## 2022-06-19 NOTE — Progress Notes (Signed)
ROB [redacted]w[redacted]d  CC: Pressure and discomfort  Pt desires cervix check.

## 2022-06-19 NOTE — Progress Notes (Signed)
   PRENATAL VISIT NOTE  Subjective:  Deanna Campbell is a 21 y.o. G1P0 at [redacted]w[redacted]d being seen today for ongoing prenatal care.  She is currently monitored for the following issues for this low-risk pregnancy and has Rh negative state in antepartum period; Encounter for supervision of normal pregnancy; Late prenatal care; Elevated blood-pressure reading without diagnosis of hypertension; Seasonal allergic rhinitis; Anxiety; Major depressive disorder, single episode, unspecified; Nausea and vomiting during pregnancy; and UTI (urinary tract infection) during pregnancy, third trimester on their problem list.  Patient reports occasional contractions.  Contractions: Irritability. Vag. Bleeding: None.  Movement: Present. Denies leaking of fluid.   The following portions of the patient's history were reviewed and updated as appropriate: allergies, current medications, past family history, past medical history, past social history, past surgical history and problem list.   Objective:   Vitals:   06/19/22 1051  BP: 116/73  Pulse: 80  Weight: 174 lb (78.9 kg)    Fetal Status: Fetal Heart Rate (bpm): 156 Fundal Height: 37 cm Movement: Present  Presentation: Vertex  General:  Alert, oriented and cooperative. Patient is in no acute distress.  Skin: Skin is warm and dry. No rash noted.   Cardiovascular: Normal heart rate noted  Respiratory: Normal respiratory effort, no problems with respiration noted  Abdomen: Soft, gravid, appropriate for gestational age.  Pain/Pressure: Present     Pelvic: Cervical exam deferred Dilation: 1 Effacement (%): 50 Station: Ballotable  Extremities: Normal range of motion.  Edema: Trace  Mental Status: Normal mood and affect. Normal behavior. Normal judgment and thought content.   Assessment and Plan:  Pregnancy: G1P0 at [redacted]w[redacted]d 1. Encounter for supervision of normal first pregnancy in third trimester Routine care. GBS neg. D/w her re: setting up post dates IOL nv. Afi/nst if  goes past 40wks  Term labor symptoms and general obstetric precautions including but not limited to vaginal bleeding, contractions, leaking of fluid and fetal movement were reviewed in detail with the patient. Please refer to After Visit Summary for other counseling recommendations.   Return in about 1 week (around 06/26/2022) for in person, low risk ob, md or app.  Future Appointments  Date Time Provider Department Center  06/25/2022 10:15 AM Reva Bores, MD [facility]  07/03/2022 10:55 AM [place] Bing, MD [facility]    [place] Bing, MD

## 2022-06-19 NOTE — Addendum Note (Signed)
Addended by: [place] Bing on: 06/19/2022 12:56 PM   Modules accepted: Orders

## 2022-06-25 ENCOUNTER — Ambulatory Visit (INDEPENDENT_AMBULATORY_CARE_PROVIDER_SITE_OTHER): Payer: Medicaid Other | Admitting: Family Medicine

## 2022-06-25 VITALS — BP 119/76 | HR 78 | Wt 175.0 lb

## 2022-06-25 DIAGNOSIS — Z3403 Encounter for supervision of normal first pregnancy, third trimester: Secondary | ICD-10-CM

## 2022-06-25 DIAGNOSIS — Z6791 Unspecified blood type, Rh negative: Secondary | ICD-10-CM

## 2022-06-25 DIAGNOSIS — O26899 Other specified pregnancy related conditions, unspecified trimester: Secondary | ICD-10-CM

## 2022-06-25 NOTE — Addendum Note (Signed)
Addended by: Reva Bores on: 06/25/2022 01:41 PM   Modules accepted: Orders

## 2022-06-25 NOTE — Progress Notes (Signed)
   PRENATAL VISIT NOTE  Subjective:  Deanna Campbell is a 21 y.o. G1P0 at [redacted]w[redacted]d being seen today for ongoing prenatal care.  She is currently monitored for the following issues for this low-risk pregnancy and has Rh negative state in antepartum period; Encounter for supervision of normal pregnancy; Late prenatal care; Elevated blood-pressure reading without diagnosis of hypertension; Seasonal allergic rhinitis; Anxiety; Major depressive disorder, single episode, unspecified; Nausea and vomiting during pregnancy; and UTI (urinary tract infection) during pregnancy, third trimester on their problem list.  Patient reports no complaints.  Contractions: Irritability. Vag. Bleeding: None.  Movement: Present. Denies leaking of fluid.   The following portions of the patient's history were reviewed and updated as appropriate: allergies, current medications, past family history, past medical history, past social history, past surgical history and problem list.   Objective:   Vitals:   06/25/22 1041  BP: 119/76  Pulse: 78  Weight: 175 lb (79.4 kg)    Fetal Status: Fetal Heart Rate (bpm): 140 Fundal Height: 37 cm Movement: Present  Presentation: Vertex  General:  Alert, oriented and cooperative. Patient is in no acute distress.  Skin: Skin is warm and dry. No rash noted.   Cardiovascular: Normal heart rate noted  Respiratory: Normal respiratory effort, no problems with respiration noted  Abdomen: Soft, gravid, appropriate for gestational age.  Pain/Pressure: Present     Pelvic: Cervical exam performed in the presence of a chaperone could not reach her cervix       Extremities: Normal range of motion.  Edema: Trace  Mental Status: Normal mood and affect. Normal behavior. Normal judgment and thought content.   Assessment and Plan:  Pregnancy: G1P0 at [redacted]w[redacted]d 1. Rh negative state in antepartum period S/p rhogam at 28 weeks and pp if indicated  2. Encounter for supervision of normal first pregnancy in  third trimester Continue routine prenatal care. IOL scheduled at 40 weeks  Term labor symptoms and general obstetric precautions including but not limited to vaginal bleeding, contractions, leaking of fluid and fetal movement were reviewed in detail with the patient. Please refer to After Visit Summary for other counseling recommendations.   Return in 1 week (on 07/02/2022).  Future Appointments  Date Time Provider Department Center  07/03/2022 10:55 AM [place] Bing, MD [facility]  07/05/2022 12:00 AM [facility] SCHED ROOM [facility] None    Reva Bores, MD

## 2022-06-26 ENCOUNTER — Encounter: Payer: Medicaid Other | Admitting: Advanced Practice Midwife

## 2022-06-27 ENCOUNTER — Other Ambulatory Visit: Payer: Self-pay | Admitting: Advanced Practice Midwife

## 2022-07-01 ENCOUNTER — Telehealth (HOSPITAL_COMMUNITY): Payer: Self-pay | Admitting: *Deleted

## 2022-07-01 ENCOUNTER — Encounter (HOSPITAL_COMMUNITY): Payer: Self-pay | Admitting: *Deleted

## 2022-07-01 NOTE — Telephone Encounter (Signed)
Preadmission screen  

## 2022-07-03 ENCOUNTER — Ambulatory Visit (INDEPENDENT_AMBULATORY_CARE_PROVIDER_SITE_OTHER): Payer: Medicaid Other | Admitting: Obstetrics and Gynecology

## 2022-07-03 VITALS — BP 122/77 | HR 56 | Wt 177.0 lb

## 2022-07-03 DIAGNOSIS — Z3A40 40 weeks gestation of pregnancy: Secondary | ICD-10-CM

## 2022-07-03 NOTE — Progress Notes (Signed)
   PRENATAL VISIT NOTE  Subjective:  Deanna Campbell is a 21 y.o. G1P0 at [redacted]w[redacted]d being seen today for ongoing prenatal care.  She is currently monitored for the following issues for this low-risk pregnancy and has Rh negative state in antepartum period; Encounter for supervision of normal pregnancy; Late prenatal care; Elevated blood-pressure reading without diagnosis of hypertension; Seasonal allergic rhinitis; Anxiety; Major depressive disorder, single episode, unspecified; Nausea and vomiting during pregnancy; and UTI (urinary tract infection) during pregnancy, third trimester on their problem list.  Patient reports no complaints.  Contractions: Not present. Vag. Bleeding: None.  Movement: Present. Denies leaking of fluid.   The following portions of the patient's history were reviewed and updated as appropriate: allergies, current medications, past family history, past medical history, past social history, past surgical history and problem list.   Objective:   Vitals:   07/03/22 1111  BP: 122/77  Pulse: (!) 56  Weight: 177 lb (80.3 kg)    Fetal Status: Fetal Heart Rate (bpm): 118 Fundal Height: 39 cm Movement: Present  Presentation: Vertex  General:  Alert, oriented and cooperative. Patient is in no acute distress.  Skin: Skin is warm and dry. No rash noted.   Cardiovascular: Normal heart rate noted  Respiratory: Normal respiratory effort, no problems with respiration noted  Abdomen: Soft, gravid, appropriate for gestational age.  Pain/Pressure: Absent     Pelvic: Cervical exam performed in the presence of a chaperone Dilation: 1 Effacement (%): 50 Station: -2  Extremities: Normal range of motion.  Edema: Trace  Mental Status: Normal mood and affect. Normal behavior. Normal judgment and thought content.   Assessment and Plan:  Pregnancy: G1P0 at [redacted]w[redacted]d 1. [redacted] weeks gestation of pregnancy Set up for PDIOL tomorrow at 1145pm GBS neg  Term labor symptoms and general obstetric  precautions including but not limited to vaginal bleeding, contractions, leaking of fluid and fetal movement were reviewed in detail with the patient. Please refer to After Visit Summary for other counseling recommendations.   No follow-ups on file.  Future Appointments  Date Time Provider Department Center  07/05/2022 12:00 AM [facility] SCHED RO[facility] None    [place] Bing, MD

## 2022-07-05 ENCOUNTER — Encounter (HOSPITAL_COMMUNITY): Payer: Self-pay | Admitting: Family Medicine

## 2022-07-05 ENCOUNTER — Inpatient Hospital Stay (HOSPITAL_COMMUNITY)
Admission: AD | Admit: 2022-07-05 | Discharge: 2022-07-08 | DRG: 807 | Disposition: A | Discharge status: Home / Self Care | Payer: Medicaid Other | Attending: Obstetrics & Gynecology | Admitting: Obstetrics & Gynecology

## 2022-07-05 ENCOUNTER — Inpatient Hospital Stay (HOSPITAL_COMMUNITY): Admission: RE | Admit: 2022-07-05 | Payer: Medicaid Other | Source: Ambulatory Visit

## 2022-07-05 ENCOUNTER — Other Ambulatory Visit: Payer: Self-pay

## 2022-07-05 DIAGNOSIS — O480 Post-term pregnancy: Secondary | ICD-10-CM | POA: Diagnosis present

## 2022-07-05 DIAGNOSIS — Z30430 Encounter for insertion of intrauterine contraceptive device: Secondary | ICD-10-CM

## 2022-07-05 DIAGNOSIS — Z3A40 40 weeks gestation of pregnancy: Secondary | ICD-10-CM | POA: Diagnosis not present

## 2022-07-05 DIAGNOSIS — O134 Gestational [pregnancy-induced] hypertension without significant proteinuria, complicating childbirth: Secondary | ICD-10-CM | POA: Diagnosis present

## 2022-07-05 DIAGNOSIS — Z3403 Encounter for supervision of normal first pregnancy, third trimester: Secondary | ICD-10-CM

## 2022-07-05 DIAGNOSIS — O26899 Other specified pregnancy related conditions, unspecified trimester: Secondary | ICD-10-CM

## 2022-07-05 DIAGNOSIS — O26893 Other specified pregnancy related conditions, third trimester: Secondary | ICD-10-CM | POA: Diagnosis present

## 2022-07-05 DIAGNOSIS — Z6791 Unspecified blood type, Rh negative: Secondary | ICD-10-CM

## 2022-07-05 DIAGNOSIS — O0930 Supervision of pregnancy with insufficient antenatal care, unspecified trimester: Secondary | ICD-10-CM

## 2022-07-05 DIAGNOSIS — F329 Major depressive disorder, single episode, unspecified: Secondary | ICD-10-CM | POA: Diagnosis present

## 2022-07-05 LAB — CBC
HCT: 32.9 % — ABNORMAL LOW (ref 36.0–46.0)
Hemoglobin: 10.9 g/dL — ABNORMAL LOW (ref 12.0–15.0)
MCH: 31.1 pg (ref 26.0–34.0)
MCHC: 33.1 g/dL (ref 30.0–36.0)
MCV: 93.7 fL (ref 80.0–100.0)
Platelets: 183 10*3/uL (ref 150–400)
RBC: 3.51 MIL/uL — ABNORMAL LOW (ref 3.87–5.11)
RDW: 13.3 % (ref 11.5–15.5)
WBC: 5.4 10*3/uL (ref 4.0–10.5)
nRBC: 0 % (ref 0.0–0.2)

## 2022-07-05 LAB — TYPE AND SCREEN
ABO/RH(D): O NEG
Antibody Screen: NEGATIVE

## 2022-07-05 MED ORDER — OXYTOCIN BOLUS FROM INFUSION
333.0000 mL | Freq: Once | INTRAVENOUS | Status: AC
  Administered 2022-07-07: 333 mL via INTRAVENOUS

## 2022-07-05 MED ORDER — SOD CITRATE-CITRIC ACID 500-334 MG/5ML PO SOLN
30.0000 mL | ORAL | Status: DC | PRN
Start: 2022-07-05 — End: 2022-07-07

## 2022-07-05 MED ORDER — LIDOCAINE HCL (PF) 1 % IJ SOLN
30.0000 mL | INTRAMUSCULAR | Status: AC | PRN
Start: 2022-07-05 — End: 2022-07-07
  Administered 2022-07-07: 30 mL via SUBCUTANEOUS
  Filled 2022-07-05: qty 30

## 2022-07-05 MED ORDER — FLEET ENEMA 7-19 GM/118ML RE ENEM: 1.0000 | ENEMA | RECTAL | Status: DC | PRN

## 2022-07-05 MED ORDER — OXYTOCIN-SODIUM CHLORIDE 30-0.9 UT/500ML-% IV SOLN
2.5000 [IU]/h | INTRAVENOUS | Status: DC
  Filled 2022-07-05: qty 500

## 2022-07-05 MED ORDER — TERBUTALINE SULFATE 1 MG/ML IJ SOLN
0.2500 mg | Freq: Once | INTRAMUSCULAR | Status: DC | PRN
Start: 2022-07-05 — End: 2022-07-07

## 2022-07-05 MED ORDER — FENTANYL CITRATE (PF) 100 MCG/2ML IJ SOLN
100.0000 ug | INTRAMUSCULAR | Status: DC | PRN
  Administered 2022-07-06 (×4): 100 ug via INTRAVENOUS
  Filled 2022-07-05 (×4): qty 2

## 2022-07-05 MED ORDER — OXYCODONE-ACETAMINOPHEN 5-325 MG PO TABS: 1.0000 | ORAL_TABLET | ORAL | Status: DC | PRN

## 2022-07-05 MED ORDER — LACTATED RINGERS IV SOLN: 500.0000 mL | INTRAVENOUS | Status: DC | PRN

## 2022-07-05 MED ORDER — LACTATED RINGERS IV SOLN: INTRAVENOUS | Status: DC

## 2022-07-05 MED ORDER — ONDANSETRON HCL 4 MG/2ML IJ SOLN
4.0000 mg | Freq: Four times a day (QID) | INTRAMUSCULAR | Status: DC | PRN
  Administered 2022-07-05 – 2022-07-07 (×4): 4 mg via INTRAVENOUS
  Filled 2022-07-05 (×4): qty 2

## 2022-07-05 MED ORDER — OXYCODONE-ACETAMINOPHEN 5-325 MG PO TABS: 2.0000 | ORAL_TABLET | ORAL | Status: DC | PRN

## 2022-07-05 MED ORDER — ACETAMINOPHEN 325 MG PO TABS
650.0000 mg | ORAL_TABLET | ORAL | Status: DC | PRN
Start: 2022-07-05 — End: 2022-07-07

## 2022-07-05 MED ORDER — FENTANYL CITRATE (PF) 100 MCG/2ML IJ SOLN
INTRAMUSCULAR | Status: AC
  Administered 2022-07-05: 100 ug via INTRAVENOUS
  Filled 2022-07-05: qty 2

## 2022-07-05 MED ORDER — MISOPROSTOL 25 MCG QUARTER TABLET
25.0000 ug | ORAL_TABLET | ORAL | Status: DC | PRN
  Administered 2022-07-05: 25 ug via VAGINAL
  Filled 2022-07-05: qty 1

## 2022-07-05 NOTE — Plan of Care (Signed)
  Problem: Education: Goal: Knowledge of General Education information will improve Description: Including pain rating scale, medication(s)/side effects and non-pharmacologic comfort measures 07/05/2022 2127 by Joretta Bachelor, RN Outcome: Progressing 07/05/2022 2127 by Joretta Bachelor, RN Outcome: Progressing   Problem: Health Behavior/Discharge Planning: Goal: Ability to manage health-related needs will improve 07/05/2022 2127 by Joretta Bachelor, RN Outcome: Progressing 07/05/2022 2127 by Joretta Bachelor, RN Outcome: Progressing   Problem: Clinical Measurements: Goal: Ability to maintain clinical measurements within normal limits will improve 07/05/2022 2127 by Joretta Bachelor, RN Outcome: Progressing 07/05/2022 2127 by Joretta Bachelor, RN Outcome: Progressing Goal: Will remain free from infection 07/05/2022 2127 by Joretta Bachelor, RN Outcome: Progressing 07/05/2022 2127 by Joretta Bachelor, RN Outcome: Progressing Goal: Diagnostic test results will improve 07/05/2022 2127 by Joretta Bachelor, RN Outcome: Progressing 07/05/2022 2127 by Joretta Bachelor, RN Outcome: Progressing Goal: Respiratory complications will improve 07/05/2022 2127 by Joretta Bachelor, RN Outcome: Progressing 07/05/2022 2127 by Joretta Bachelor, RN Outcome: Progressing Goal: Cardiovascular complication will be avoided 07/05/2022 2127 by Joretta Bachelor, RN Outcome: Progressing 07/05/2022 2127 by Joretta Bachelor, RN Outcome: Progressing   Problem: Activity: Goal: Risk for activity intolerance will decrease 07/05/2022 2127 by Joretta Bachelor, RN Outcome: Progressing 07/05/2022 2127 by Joretta Bachelor, RN Outcome: Progressing   Problem: Nutrition: Goal: Adequate nutrition will be maintained 07/05/2022 2127 by Joretta Bachelor, RN Outcome: Progressing 07/05/2022 2127 by Joretta Bachelor, RN Outcome: Progressing   Problem: Coping: Goal: Level of anxiety will decrease 07/05/2022 2127 by Joretta Bachelor, RN Outcome:  Progressing 07/05/2022 2127 by Joretta Bachelor, RN Outcome: Progressing   Problem: Elimination: Goal: Will not experience complications related to bowel motility Outcome: Progressing Goal: Will not experience complications related to urinary retention Outcome: Progressing   Problem: Pain Managment: Goal: General experience of comfort will improve Outcome: Progressing   Problem: Safety: Goal: Ability to remain free from injury will improve Outcome: Progressing   Problem: Skin Integrity: Goal: Risk for impaired skin integrity will decrease Outcome: Progressing

## 2022-07-05 NOTE — H&P (Signed)
OBSTETRIC ADMISSION HISTORY AND PHYSICAL  Joelene Barriere is a 21 y.o. female G1P0 with IUP at [redacted]w[redacted]d by early [place] presenting for IOL for post dates. She reports +FMs, No LOF, no VB, no blurry vision, headaches or peripheral edema, and RUQ pain.  She plans on breast feeding. She request post placental IUD for birth control. She received her prenatal care at  [facility].    Dating: By early [place] --->  Estimated Date of Delivery: 07/02/22  Sono:    @[redacted]w[redacted]d , CWD, normal anatomy, vertex presentation, longitudinal lie, 24th% EFW   Nursing Staff Provider  Office Location  [place] Dating   edc 8/2  [facility] Model [X]  Traditional [ ]  Centering [ ]  Mom-Baby Dyad    Language   English  Anatomy [facility]  Normal  Flu Vaccine    Genetic/Carrier Screen  NIPS:  Low risk panorama AFP:   wnl Horizon: neg4  TDaP Vaccine   04/10/22 Hgb A1C or  GTT Early n/a Third trimester Nml 2 hr GTT  COVID Vaccine  x3 Pfizer    LAB RESULTS   Rhogam  01/16/2022, 04/17/2022 Blood Type O/Negative/-- (03/16 1554)   Baby Feeding Plan  Breast Antibody Negative (05/18 0841)  Contraception  IUD Rubella 2.08 (03/16 1554)imm  Circumcision  Yes if boy RPR Non Reactive (05/18 0841) neg  Pediatrician   Undecided HBsAg Negative (03/16 1554) neg  Support Person  FOB/Mom HCVAb Negative (03/16 1554)neg  Prenatal Classes  Yes  HIV Non Reactive (05/18 0843)   neg  BTL Consent  GBS Negative/-- (07/06 1129)neg  VBAC Consent  Pap Too young       DME Rx [ ]  BP cuff [ ]  Weight Scale Waterbirth  [ ]  Class [ ]  Consent [ ]  CNM visit  PHQ9 & GAD7 [  ] new OB [  ] 28 weeks  [  ] 36 weeks Induction  [ ]  Orders Entered [ ] Foley Y/N    Prenatal History/Complications: none  Past Medical History: Past Medical History:  Diagnosis Date   Anxiety    Depression     Past Surgical History: Past Surgical History:  Procedure Laterality Date   NO PAST SURGERIES      Obstetrical History: OB History     Gravida  1   Para      Term      Preterm      AB       Living         SAB      IAB      Ectopic      Multiple      Live Births              Social History Social History   Socioeconomic History   Marital status: Single    Spouse name: Not on file   Number of children: Not on file   Years of education: Not on file   Highest education level: Not on file  Occupational History   Not on file  Tobacco Use   Smoking status: Never   Smokeless tobacco: Never  Vaping Use   Vaping Use: Never used  Substance and Sexual Activity   Alcohol use: Never   Drug use: Never   Sexual activity: Not Currently  Other Topics Concern   Not on file  Social History Narrative   Not on file   Social Determinants of Health   Financial Resource Strain: Not on file  Food Insecurity: Not on file  Transportation Needs: Not on file  Physical Activity: Not on file  Stress: Not on file  Social Connections: Not on file    Family History: Family History  Problem Relation Age of Onset   Asthma Brother     Allergies: No Known Allergies  Medications Prior to Admission  Medication Sig Dispense Refill Last Dose   cyclobenzaprine (FLEXERIL) 10 MG tablet Take 1 tablet (10 mg total) by mouth 2 (two) times daily as needed for muscle spasms. 20 tablet 0    diphenhydrAMINE (BENADRYL) 25 mg capsule Take 25 mg by mouth every 6 (six) hours as needed. (Patient not taking: Reported on 04/24/2022)      Doxylamine-Pyridoxine 10-10 MG TBEC Take 2 tablets at bedtime with pyridoxine for moderate nausea/vomiting      ferrous sulfate (FERROUSUL) 325 (65 FE) MG tablet Take 1 tablet (325 mg total) by mouth every other day. 60 tablet 1    hydrocortisone (ANUSOL-HC) 2.5 % rectal cream Place rectally 2 (two) times daily. (Patient not taking: Reported on 05/22/2022) 30 g 2    ondansetron (ZOFRAN) 4 MG tablet Take 1 tablet (4 mg total) by mouth every 8 (eight) hours as needed for nausea or vomiting. 30 tablet 1    pantoprazole (PROTONIX) 40 MG tablet Take 1 tablet  (40 mg total) by mouth daily. 30 tablet 2    polyethylene glycol powder (GLYCOLAX/MIRALAX) 17 GM/SCOOP powder Take 17 g by mouth daily as needed. 510 g 1    Prenatal Vit-Fe Fumarate-FA (MULTIVITAMIN-PRENATAL) 27-0.8 MG TABS tablet Take 1 tablet by mouth daily at 12 noon.      promethazine (PHENERGAN) 12.5 MG tablet Take 1 tablet (12.5 mg total) by mouth every 6 (six) hours as needed for nausea or vomiting. 30 tablet 4    vitamin B-12 (CYANOCOBALAMIN) 1000 MCG tablet Take 1 tablet (1,000 mcg total) by mouth daily. Take 1 tablet daily for 60 days 30 tablet 1    vitamin B-6 (PYRIDOXINE) 25 MG tablet Take 1/2 to 1 tablet for mild nausea/vomiting        Review of Systems   All systems reviewed and negative except as stated in HPI  Blood pressure 119/81, pulse 88, temperature 98 F (36.7 C), temperature source Oral, resp. rate 20, last menstrual period 09/21/2021. General appearance: alert, cooperative, and appears stated age Lungs: clear to auscultation bilaterally Heart: regular rate and rhythm Abdomen: soft, non-tender; bowel sounds normal Pelvic: per RN check below Extremities: Homans sign is negative, no sign of DVT Presentation: cephalic Fetal monitoringBaseline: 125 bpm, Variability: moderate, Accelerations: present, and Decelerations: no decels Uterine activity: no contractions.  Dilation: 1 Effacement (%): 40 Station: -3 Exam by:: [employer]. RN   Prenatal labs: ABO, Rh: --/--/PENDING (08/05 1421) Antibody: PENDING (08/05 1421) Rubella: 2.08 (03/16 1554) RPR: Non Reactive (05/18 0841)  HBsAg: Negative (03/16 1554)  HIV: Non Reactive (05/18 0843)  GBS: Negative/-- (07/06 1129)  Genetic screening  normal Anatomy US normal  Prenatal Transfer Tool  Maternal Diabetes: No Genetic Screening: Normal Maternal Ultrasounds/Referrals: Normal Fetal Ultrasounds or other Referrals:  None Maternal Substance Abuse:  No Significant Maternal Medications:  None Significant  Maternal Lab Results: Rh negative  Results for orders placed or performed during the hospital encounter of 07/05/22 (from the past 24 hour(s))  Type and screen   Collection Time: 07/05/22  2:21 PM  Result Value Ref Range   ABO/RH(D) PENDING    Antibody Screen PENDING    Sample Expiration      07/08/2022,2359  Performed at [facility], [address] [place], [place] [address]     Patient Active Problem List   Diagnosis Date Noted   Post term pregnancy over 40 weeks 07/05/2022   UTI (urinary tract infection) during pregnancy, third trimester 04/10/2022   Nausea and vomiting during pregnancy 03/13/2022   Elevated blood-pressure reading without diagnosis of hypertension 02/19/2022   Seasonal allergic rhinitis 02/19/2022   Anxiety 02/19/2022   Major depressive disorder, single episode, unspecified 02/19/2022   Encounter for supervision of normal pregnancy 02/13/2022   Late prenatal care 02/13/2022   Rh negative state in antepartum period 01/16/2022    Assessment/Plan:  Ameira Alessandrini is a 21 y.o. G1P0 at [redacted]w[redacted]d here for IOL for post dates. Pregnancy has been uncomplicated.   #Labor:Admit for IOL. Discussed options with patient.  - start on misoprostol for cervical ripening  RH negative - plan for rhogam is baby is RH positive  #Pain: Per patient's request  #FWB: Category 1 #ID:  GBS negative #MOF: Breast #MOC:Postplacental IUD #Circ:  yes  Sheppard Evens MD MPH [facility] Fellow, [facility]

## 2022-07-05 NOTE — Progress Notes (Signed)
Labor Progress Note Deanna Campbell is a 21 y.o. G1P0 at [redacted]w[redacted]d presented for IOL for PD  S:  Comfortable. Not feeling ctx.  O:  BP 127/79 (BP Location: Right Arm)   Pulse 70   Temp 98.6 F (37 C) (Oral)   Resp 20   LMP 09/21/2021  EFM: baseline 140 bpm/ mod variability/ + accels/ no decels  Toco/IUPC: UI, irregular SVE: Dilation: 1 Effacement (%): 40 Cervical Position: Posterior Station: -3 Presentation: Vertex Exam by:: Thomes Cake RN   A/P: 21 y.o. G1P0 [redacted]w[redacted]d  1. Labor: latent 2. FWB: Cat I 3. Pain: analgesia/anesthesia/NO prn   S/p Cytotec x1. Consented for FB insertion, placed w/o difficulty. Anticipate SVD.  Donette Larry, CNM 8:59 PM

## 2022-07-06 ENCOUNTER — Inpatient Hospital Stay (HOSPITAL_COMMUNITY): Payer: Medicaid Other | Admitting: Anesthesiology

## 2022-07-06 LAB — RPR: RPR Ser Ql: NONREACTIVE

## 2022-07-06 MED ORDER — PHENYLEPHRINE 80 MCG/ML (10ML) SYRINGE FOR IV PUSH (FOR BLOOD PRESSURE SUPPORT)
80.0000 ug | PREFILLED_SYRINGE | INTRAVENOUS | Status: DC | PRN
  Filled 2022-07-06: qty 10

## 2022-07-06 MED ORDER — LIDOCAINE-EPINEPHRINE (PF) 2 %-1:200000 IJ SOLN
INTRAMUSCULAR | Status: DC | PRN
  Administered 2022-07-06: 5 mL via EPIDURAL
  Administered 2022-07-06: 2 mL via EPIDURAL

## 2022-07-06 MED ORDER — LEVONORGESTREL 20 MCG/DAY IU IUD
1.0000 | INTRAUTERINE_SYSTEM | Freq: Once | INTRAUTERINE | Status: AC
  Administered 2022-07-07: 1 via INTRAUTERINE
  Filled 2022-07-06: qty 1

## 2022-07-06 MED ORDER — PHENYLEPHRINE 80 MCG/ML (10ML) SYRINGE FOR IV PUSH (FOR BLOOD PRESSURE SUPPORT)
80.0000 ug | PREFILLED_SYRINGE | INTRAVENOUS | Status: DC | PRN
Start: 2022-07-06 — End: 2022-07-07

## 2022-07-06 MED ORDER — LACTATED RINGERS IV SOLN: 500.0000 mL | Freq: Once | INTRAVENOUS | Status: DC

## 2022-07-06 MED ORDER — FENTANYL CITRATE (PF) 100 MCG/2ML IJ SOLN
INTRAMUSCULAR | Status: DC | PRN
  Administered 2022-07-06: 100 ug via EPIDURAL

## 2022-07-06 MED ORDER — EPHEDRINE 5 MG/ML INJ: 10.0000 mg | INTRAVENOUS | Status: DC | PRN

## 2022-07-06 MED ORDER — DIPHENHYDRAMINE HCL 50 MG/ML IJ SOLN: 12.5000 mg | INTRAMUSCULAR | Status: DC | PRN

## 2022-07-06 MED ORDER — LIDOCAINE HCL (PF) 1 % IJ SOLN
INTRAMUSCULAR | Status: DC | PRN
  Administered 2022-07-06: 10 mL via EPIDURAL

## 2022-07-06 MED ORDER — EPHEDRINE 5 MG/ML INJ
10.0000 mg | INTRAVENOUS | Status: DC | PRN
Start: 2022-07-06 — End: 2022-07-07

## 2022-07-06 MED ORDER — TERBUTALINE SULFATE 1 MG/ML IJ SOLN: 0.2500 mg | Freq: Once | INTRAMUSCULAR | Status: DC | PRN

## 2022-07-06 MED ORDER — FENTANYL-BUPIVACAINE-NACL 0.5-0.125-0.9 MG/250ML-% EP SOLN
12.0000 mL/h | EPIDURAL | Status: DC | PRN
  Administered 2022-07-06 (×2): 12 mL/h via EPIDURAL
  Filled 2022-07-06 (×2): qty 250

## 2022-07-06 MED ORDER — SODIUM CHLORIDE 0.9 % IV SOLN
25.0000 mg | Freq: Four times a day (QID) | INTRAVENOUS | Status: DC | PRN
  Administered 2022-07-06 (×2): 25 mg via INTRAVENOUS
  Filled 2022-07-06 (×3): qty 1

## 2022-07-06 MED ORDER — HYDROXYZINE HCL 50 MG/ML IM SOLN
50.0000 mg | Freq: Once | INTRAMUSCULAR | Status: AC
  Administered 2022-07-06: 50 mg via INTRAMUSCULAR
  Filled 2022-07-06: qty 1

## 2022-07-06 MED ORDER — OXYTOCIN-SODIUM CHLORIDE 30-0.9 UT/500ML-% IV SOLN
1.0000 m[IU]/min | INTRAVENOUS | Status: DC
  Administered 2022-07-06: 2 m[IU]/min via INTRAVENOUS
  Filled 2022-07-06: qty 500

## 2022-07-06 NOTE — Anesthesia Preprocedure Evaluation (Signed)
Anesthesia Evaluation  Patient identified by MRN, date of birth, ID band Patient awake    Reviewed: Allergy & Precautions, H&P , Patient's Chart, lab work & pertinent test results  Airway Mallampati: I       Dental no notable dental hx.    Pulmonary neg pulmonary ROS,    Pulmonary exam normal        Cardiovascular negative cardio ROS Normal cardiovascular exam     Neuro/Psych negative neurological ROS     GI/Hepatic Neg liver ROS, GERD  Medicated,  Endo/Other  negative endocrine ROS  Renal/GU negative Renal ROS  negative genitourinary   Musculoskeletal negative musculoskeletal ROS (+)   Abdominal Normal abdominal exam  (+)   Peds  Hematology  (+) Blood dyscrasia, anemia ,   Anesthesia Other Findings   Reproductive/Obstetrics (+) Pregnancy                             Anesthesia Physical Anesthesia Plan  ASA: 2  Anesthesia Plan: Epidural   Post-op Pain Management:    Induction:   PONV Risk Score and Plan:   Airway Management Planned:   Additional Equipment:   Intra-op Plan:   Post-operative Plan:   Informed Consent: I have reviewed the patients History and Physical, chart, labs and discussed the procedure including the risks, benefits and alternatives for the proposed anesthesia with the patient or authorized representative who has indicated his/her understanding and acceptance.       Plan Discussed with:   Anesthesia Plan Comments:         Anesthesia Quick Evaluation

## 2022-07-06 NOTE — Progress Notes (Signed)
Patient ID: Deanna Campbell, female   DOB: [birth year], 21 y.o.   MRN: [id number]  CTSP for unrelenting abd discomfort with consistently firm abd; cx C/C/vtx +1; pt initially really uncomfortable but after pushing with 2 ctx now feels much better. Not feeling rectal pressure/urge to push, so with stable FHR at 120-130s, +accels, no decels, +LTV, will labor down. Pit decreased from 55mu/min to 75mu/min and fluid bolus given. Will reexamine in about an hour to assess for pushing, or sooner with symptoms.  Anticipate vag del. [name] CNM 07/06/2022 4:45 PM

## 2022-07-06 NOTE — Anesthesia Procedure Notes (Addendum)
Epidural Patient location during procedure: OB Start time: 07/06/2022 7:50 AM End time: 07/06/2022 7:54 AM  Staffing Anesthesiologist: Leilani Able, MD Performed: anesthesiologist   Preanesthetic Checklist Completed: patient identified, IV checked, site marked, risks and benefits discussed, surgical consent, monitors and equipment checked, pre-op evaluation and timeout performed  Epidural Patient position: sitting Prep: DuraPrep and site prepped and draped Patient monitoring: continuous pulse ox and blood pressure Approach: midline Location: L3-L4 Injection technique: LOR air  Needle:  Needle type: Tuohy  Needle gauge: 17 G Needle length: 9 cm and 9 Needle insertion depth: 5 cm cm Catheter type: closed end flexible Catheter size: 19 Gauge Catheter at skin depth: 10 cm Test dose: negative and Other  Assessment Events: blood not aspirated, injection not painful, no injection resistance, no paresthesia and negative IV test  Additional Notes Reason for block:procedure for pain

## 2022-07-06 NOTE — Progress Notes (Signed)
Patient ID: Krishawna Stiefel, female   DOB: 16-Oct-2001, 21 y.o.   MRN: [id number]  Onset of abd/flank throbbing pain again and anesthesia has been in to redose; after 20 mins, it began to take effect and she appears to be resting comfortably; is now able to   FHR 125-135, +accels, occ mi variables, Cat 1 Ctx difficult to trace but abd is soft most of the time (Pit was turned off) Cx C/C/vtx +1  IUP@40 .4wks End 1st stage  No indication of uterine rupture, placental abruption, or Triple I to be causing such abd pain; now that she's comfortable, we will reassess her ctx pattern and start the Pitocin back prn  [name] CNM 07/06/2022

## 2022-07-06 NOTE — Progress Notes (Signed)
Patient ID: Deanna Campbell, female   DOB: [birth year], 21 y.o.   MRN: [id number]  Comfortable w epidural  Two recent BPs^ 141/83, 134/95 followed by 121/76  FHR 125-130, +accels, occ mi variables Ctx q 2-4 mins with Pit at Cx deferred  IUP@40 .4wks Active labor  Plan for cx check in about an hour Continue to watch BPs  [name] CNM 07/06/2022 3:20 PM

## 2022-07-06 NOTE — Progress Notes (Addendum)
Patient ID: Deanna Campbell, female   DOB: 2001-10-05, 21 y.o.   MRN: [id number]  Pt appears to be resting; just got comfortable w epidural per RN and family; not disturbed; had SROM @ 0620  BPs 106/60 FHR 120s, +accels, no decels, Cat 1 Ctx q 2 mins with Pit at 38mu/min (was turned down from 30mu/min due to tachysystole) Cx deferred (was 4-5/90/vtx -1 per RN exam @ [phone])  IUP@40 .4wks IOL process/latent labor  -Keep ctx reg w Pit; check cx in next 2hrs -Anticipate vag del  [name] CNM 07/06/2022

## 2022-07-06 NOTE — Progress Notes (Signed)
Patient ID: Deanna Campbell, female   DOB: 2001-05-16, 21 y.o.   MRN: [id number]  In to see patient and she appears to be sleeping; not disturbed  BPs 118/75, 100/85 FHR 130s, +accels, no decels Ctx q 2-3 mins with Pit @ 60mu/min Cx 6/90/vtx 0 per RN @ 1220  IUP@40 .4wks Active labor  -Plan to check cx in 2-3 hrs or sooner prn change in symptoms -Anticipate vag del  [name] CNM 07/06/2022 1:07 PM

## 2022-07-06 NOTE — Progress Notes (Signed)
Labor Progress Note Deanna Campbell is a 21 y.o. G1P0 at [redacted]w[redacted]d presented for IOL for post dates  S:  Resting quietly, had recent dose of pain med.  O:  BP 107/68   Pulse 62   Temp 97.8 F (36.6 C) (Oral)   Resp 16   LMP 09/21/2021  EFM: baseline 115 bpm/ mod variability/ + accels/ no decels  Toco/IUPC: 2-4 w/UI SVE: Dilation: 3.5 Effacement (%): 50 Cervical Position: Posterior Station: -3 Presentation: Vertex Exam by:: Mellissa Kohut, RN Pitocin: 8 mu/min  A/P: 21 y.o. G1P0 [redacted]w[redacted]d  1. Labor: latent 2. FWB: Cat I 3. Pain: analgesia/anesthesia/NO prn   Continue Pitocin titration. Anticipate labor progress and SVD.  Donette Larry, CNM 5:47 AM

## 2022-07-06 NOTE — Progress Notes (Signed)
Labor Progress Note Deanna Campbell is a 21 y.o. G1P0 at [redacted]w[redacted]d presented for IOL for post dates  S:  Comfortable but feeling some ctx.   O:  BP 127/79 (BP Location: Right Arm)   Pulse 70   Temp 98.6 F (37 C) (Oral)   Resp 20   LMP 09/21/2021  EFM: baseline 120 bpm/ mod variability/ + accels/ no decels  Toco/IUPC: irreg w/UI SVE: 3/60 per RN  A/P: 21 y.o. G1P0 [redacted]w[redacted]d  1. Labor: latent 2. FWB: Cat I 3. Pain: analgesia/anesthesia/NO prn   FB out, will start Pitocin. Anticipate labor progress and SVD.  Donette Larry, CNM 12:54 AM

## 2022-07-06 NOTE — Progress Notes (Addendum)
Patient ID: Deanna Campbell, female   DOB: 07/06/2001, 21 y.o.   MRN: [id number]  Pushing x 1h 62m now (had pushed 16m earlier but ctx were spaced out); feels like she can't go on; feeling intermittent abd pain  VSS, afeb FHR 140-150s, +accels, occ mi variables, Cat 1 Ctx now q 2-3 mins with Pit up to 7mu/min Vtx +1, +2 w pushing; just coming under pubic arch  IUP@40 .4wks Pushing  Gave pt and family pep talk and reassurance; no indications for C/S delivery at present Will now try "tug o war" to help baby come under arch Anticipate vag del  [name] [facility] 07/06/2022 11:23 PM

## 2022-07-07 ENCOUNTER — Encounter (HOSPITAL_COMMUNITY): Payer: Self-pay | Admitting: Family Medicine

## 2022-07-07 DIAGNOSIS — O480 Post-term pregnancy: Secondary | ICD-10-CM

## 2022-07-07 DIAGNOSIS — Z30430 Encounter for insertion of intrauterine contraceptive device: Secondary | ICD-10-CM

## 2022-07-07 DIAGNOSIS — Z3A40 40 weeks gestation of pregnancy: Secondary | ICD-10-CM

## 2022-07-07 LAB — CBC
HCT: 34.5 % — ABNORMAL LOW (ref 36.0–46.0)
Hemoglobin: 11.8 g/dL — ABNORMAL LOW (ref 12.0–15.0)
MCH: 31.6 pg (ref 26.0–34.0)
MCHC: 34.2 g/dL (ref 30.0–36.0)
MCV: 92.5 fL (ref 80.0–100.0)
Platelets: 166 K/uL (ref 150–400)
RBC: 3.73 MIL/uL — ABNORMAL LOW (ref 3.87–5.11)
RDW: 13.6 % (ref 11.5–15.5)
WBC: 15.7 K/uL — ABNORMAL HIGH (ref 4.0–10.5)
nRBC: 0 % (ref 0.0–0.2)

## 2022-07-07 MED ORDER — MEASLES, MUMPS & RUBELLA VAC IJ SOLR: 0.5000 mL | Freq: Once | INTRAMUSCULAR | Status: DC

## 2022-07-07 MED ORDER — BENZOCAINE-MENTHOL 20-0.5 % EX AERO
1.0000 | INHALATION_SPRAY | CUTANEOUS | Status: DC | PRN
  Filled 2022-07-07: qty 56

## 2022-07-07 MED ORDER — IBUPROFEN 600 MG PO TABS
600.0000 mg | ORAL_TABLET | Freq: Four times a day (QID) | ORAL | Status: DC
  Administered 2022-07-07 – 2022-07-08 (×6): 600 mg via ORAL
  Filled 2022-07-07 (×6): qty 1

## 2022-07-07 MED ORDER — METHYLERGONOVINE MALEATE 0.2 MG/ML IJ SOLN
INTRAMUSCULAR | Status: AC
  Administered 2022-07-07: 0.2 mg
  Filled 2022-07-07: qty 1

## 2022-07-07 MED ORDER — ZOLPIDEM TARTRATE 5 MG PO TABS: 5.0000 mg | ORAL_TABLET | Freq: Every evening | ORAL | Status: DC | PRN

## 2022-07-07 MED ORDER — WITCH HAZEL-GLYCERIN EX PADS
1.0000 | MEDICATED_PAD | CUTANEOUS | Status: DC | PRN
  Administered 2022-07-07: 1 via TOPICAL

## 2022-07-07 MED ORDER — COCONUT OIL OIL: 1.0000 | TOPICAL_OIL | Status: DC | PRN

## 2022-07-07 MED ORDER — METHYLERGONOVINE MALEATE 0.2 MG/ML IJ SOLN: 0.2000 mg | Freq: Once | INTRAMUSCULAR | Status: DC

## 2022-07-07 MED ORDER — OXYCODONE HCL 5 MG PO TABS
5.0000 mg | ORAL_TABLET | ORAL | Status: DC | PRN
  Administered 2022-07-07: 5 mg via ORAL
  Filled 2022-07-07: qty 1

## 2022-07-07 MED ORDER — ONDANSETRON HCL 4 MG PO TABS: 4.0000 mg | ORAL_TABLET | ORAL | Status: DC | PRN

## 2022-07-07 MED ORDER — RHO D IMMUNE GLOBULIN 1500 UNIT/2ML IJ SOSY
300.0000 ug | PREFILLED_SYRINGE | Freq: Once | INTRAMUSCULAR | Status: AC
Start: 2022-07-07 — End: 2022-07-07
  Administered 2022-07-07: 300 ug via INTRAVENOUS
  Filled 2022-07-07: qty 2

## 2022-07-07 MED ORDER — SENNOSIDES-DOCUSATE SODIUM 8.6-50 MG PO TABS
2.0000 | ORAL_TABLET | ORAL | Status: DC
  Administered 2022-07-07 – 2022-07-08 (×2): 2 via ORAL
  Filled 2022-07-07 (×2): qty 2

## 2022-07-07 MED ORDER — TRANEXAMIC ACID-NACL 1000-0.7 MG/100ML-% IV SOLN
INTRAVENOUS | Status: AC
  Filled 2022-07-07: qty 100

## 2022-07-07 MED ORDER — PRENATAL MULTIVITAMIN CH
1.0000 | ORAL_TABLET | Freq: Every day | ORAL | Status: DC
  Administered 2022-07-07 – 2022-07-08 (×2): 1 via ORAL
  Filled 2022-07-07 (×2): qty 1

## 2022-07-07 MED ORDER — TETANUS-DIPHTH-ACELL PERTUSSIS 5-2.5-18.5 LF-MCG/0.5 IM SUSY: 0.5000 mL | PREFILLED_SYRINGE | Freq: Once | INTRAMUSCULAR | Status: DC

## 2022-07-07 MED ORDER — SIMETHICONE 80 MG PO CHEW: 80.0000 mg | CHEWABLE_TABLET | ORAL | Status: DC | PRN

## 2022-07-07 MED ORDER — DIBUCAINE (PERIANAL) 1 % EX OINT: 1.0000 | TOPICAL_OINTMENT | CUTANEOUS | Status: DC | PRN

## 2022-07-07 MED ORDER — ONDANSETRON HCL 4 MG/2ML IJ SOLN: 4.0000 mg | INTRAMUSCULAR | Status: DC | PRN

## 2022-07-07 MED ORDER — DIPHENHYDRAMINE HCL 25 MG PO CAPS: 25.0000 mg | ORAL_CAPSULE | Freq: Four times a day (QID) | ORAL | Status: DC | PRN

## 2022-07-07 MED ORDER — ACETAMINOPHEN 325 MG PO TABS: 650.0000 mg | ORAL_TABLET | ORAL | Status: DC | PRN

## 2022-07-07 MED ORDER — TRANEXAMIC ACID-NACL 1000-0.7 MG/100ML-% IV SOLN
1000.0000 mg | Freq: Once | INTRAVENOUS | Status: AC
  Administered 2022-07-07: 1000 mg via INTRAVENOUS

## 2022-07-07 MED ORDER — NIFEDIPINE ER OSMOTIC RELEASE 30 MG PO TB24
30.0000 mg | ORAL_TABLET | Freq: Every day | ORAL | Status: DC
Start: 2022-07-07 — End: 2022-07-08
  Administered 2022-07-07 – 2022-07-08 (×2): 30 mg via ORAL
  Filled 2022-07-07 (×2): qty 1

## 2022-07-07 NOTE — Discharge Summary (Shared)
Postpartum Discharge Summary  Date of Service updated***     Patient Name: Deanna Campbell DOB: [birth year] MRN: [id number]  Date of admission: 07/05/2022 Delivery date:07/07/2022  Delivering provider: Verita Schneiders A  Date of discharge: 07/07/2022  Admitting diagnosis: Post term pregnancy over 40 weeks [O48.0] Intrauterine pregnancy: [redacted]w[redacted]d    Secondary diagnosis:  Principal Problem:   Post term pregnancy over 40 weeks Active Problems:   Rh negative state in antepartum period   Late prenatal care   Major depressive disorder, single episode, unspecified  Additional problems: none    Discharge diagnosis: Term Pregnancy Delivered                                              Post partum procedures:rhogam and postplacental Mirena placed *** Augmentation: Pitocin, Cytotec, and IP Foley Complications: None  Hospital course: Induction of Labor With Vaginal Delivery   21y.o. yo G1P0 at 444w5das admitted to the hospital 07/05/2022 for induction of labor.  Indication for induction:  Elective at 40.4wks .  Patient had a labor course remarkable for protracted 2nd stage due to discomfort w pushing in addition to pushing x 2.5hrs prior to exhaustion. Membrane Rupture Time/Date: 6:13 AM ,07/06/2022   Delivery Method:Vaginal, Vacuum (Extractor)  Episiotomy: Median  Lacerations:  2nd degree  Details of delivery can be found in separate delivery note.  Patient had a routine postpartum course. Patient is discharged home 07/07/22.  Newborn Data: Birth date:07/07/2022  Birth time:1:00 AM  Gender:Female  Living status:  Apgars:9 ,9  Weight:   Magnesium Sulfate received: No BMZ received: No Rhophylac:Yes*** MMR:N/A T-DaP:Given prenatally Flu: No Transfusion:{Transfusion received:30440034}  Physical exam  Vitals:   07/06/22 2134 07/06/22 2201 07/06/22 2332 07/06/22 2340  BP: (!) 103/43 122/83 (!) 149/99   Pulse: (!) 145 (!) 110 (!) 121   Resp:   20   Temp:    99.7 F (37.6 C)  TempSrc:     Oral  SpO2:      Weight:      Height:       General: {Exam; general:21111117} Lochia: {Desc; appropriate/inappropriate:30686::"appropriate"} Uterine Fundus: {Desc; firm/soft:30687} Incision: {Exam; incision:21111123} DVT Evaluation: {Exam; dvt:2111122} Labs: Lab Results  Component Value Date   WBC 5.4 07/05/2022   HGB 10.9 (L) 07/05/2022   HCT 32.9 (L) 07/05/2022   MCV 93.7 07/05/2022   PLT 183 07/05/2022      Latest Ref Rng & Units 04/22/2022    3:30 PM  CMP  Glucose 70 - 99 mg/dL 135   BUN 6 - 20 mg/dL <5   Creatinine 0.44 - 1.00 mg/dL 0.51   Sodium 135 - 145 mmol/L 136   Potassium 3.5 - 5.1 mmol/L 3.3   Chloride 98 - 111 mmol/L 107   CO2 22 - 32 mmol/L 22   Calcium 8.9 - 10.3 mg/dL 8.8   Total Protein 6.5 - 8.1 g/dL 6.2   Total Bilirubin 0.3 - 1.2 mg/dL 0.5   Alkaline Phos 38 - 126 U/L 87   AST 15 - 41 U/L 17   ALT 0 - 44 U/L 14    Edinburgh Score:     No data to display           After visit meds:  Allergies as of 07/07/2022   No Known Allergies   Med Rec must be completed prior  to using this [facility]***        Discharge home in stable condition Infant Feeding: {Baby feeding:23562} Infant Disposition:{CHL IP OB HOME WITH TMLYYT:03546} Discharge instruction: per After Visit Summary and Postpartum booklet. Activity: Advance as tolerated. Pelvic rest for 6 weeks.  Diet: routine diet Future Appointments:No future appointments. Follow up Visit:  Myrtis Ser, Spencer Elmer Please schedule this patient for Postpartum visit in: 6 weeks with the following provider: Any provider  In-Person  For C/S patients schedule nurse incision check in weeks 2 weeks: no  Low risk pregnancy complicated by: anxiety/depression  Delivery mode:  Vacuum  Anticipated Birth Control:  PP IUD Placed (Mirena)  PP Procedures needed: mood check visit in 2-3wks  Schedule Integrated [place] visit: offer   07/07/2022 Myrtis Ser, CNM

## 2022-07-07 NOTE — Anesthesia Postprocedure Evaluation (Signed)
Anesthesia Post Note  Patient: Deanna Campbell  Procedure(s) Performed: AN AD HOC LABOR EPIDURAL     Patient location during evaluation: Mother Baby Anesthesia Type: Epidural Level of consciousness: awake Pain management: satisfactory to patient Vital Signs Assessment: post-procedure vital signs reviewed and stable Respiratory status: spontaneous breathing Cardiovascular status: stable Anesthetic complications: no   No notable events documented.  Last Vitals:  Vitals:   07/07/22 0420 07/07/22 0830  BP: (!) 141/89 132/85  Pulse: 87 86  Resp: 18 18  Temp: 36.8 C (!) 36.3 C  SpO2: 98% 98%    Last Pain:  Vitals:   07/07/22 0844  TempSrc:   PainSc: 0-No pain   Pain Goal:                   [employer]

## 2022-07-07 NOTE — Lactation Note (Signed)
This note was copied from a baby's chart. Lactation Consultation Note  Patient Name: Deanna Campbell SLHTD'S Date: 07/07/2022 Reason for consult: Initial assessment;1st time breastfeeding;Primapara;Term;Other (Comment) (+DAT) Age:20 hours  LC in to visit with P1 birth parent (38 yr old) and FOB and grandparents.  Mom eating and baby moving around in his crib.  Baby has been sleepy and had one spoon feeding of colostrum.  Offered to assist with positioning and latching.  Removed baby's sleeper and placed him STS in football hold. Baby did gag and spit a tiny amount of frothy emesis. Baby not rooting at all.  Hand expressed drops of colostrum onto baby's lips, no rooting noted. Mom agreeable to keeping baby STS on her chest to help baby transition.  Baby relaxed and contented lying prone on Mom's chest. Asked Mom if LC could demonstrated hand expression.  Nice easy flow of colostrum.  Demonstrated how to spoon feed baby, he took 4 ml easily.   Encouraged STS and offering the breast often with feeding cues.  Baby does have bruising on caput from vacuum assisted delivery and a bump on his forehead.  Baby is a +DAT and bilirubin is being monitored.  Plan recommended- 1- STS as much as possible 2- Offer the breast with feeding cues (ask for help prn) 3-hand express colostrum and spoon feed to baby at least every 3hrs  Mom aware of IP and OP lactation support available to her.  Maternal Data Has patient been taught Hand Expression?: Yes Does the patient have breastfeeding experience prior to this delivery?: No  Feeding Mother's Current Feeding Choice: Breast Milk  LATCH Score Latch: Too sleepy or reluctant, no latch achieved, no sucking elicited.  Audible Swallowing: None  Type of Nipple: Everted at rest and after stimulation  Comfort (Breast/Nipple): Soft / non-tender  Hold (Positioning): Full assist, staff holds infant at breast  LATCH Score: 4  Interventions Interventions:  Breast feeding basics reviewed;Assisted with latch;Skin to skin;Breast massage;Hand express;Adjust position;Support pillows;Position options;Expressed milk;LC Services brochure;Education  Discharge Pump: Stork Pump (LC started the process) WIC Program: Yes  Consult Status Consult Status: Follow-up Date: 07/08/22 Follow-up type: In-patient    [name] 07/07/2022, 9:26 AM

## 2022-07-08 ENCOUNTER — Other Ambulatory Visit (HOSPITAL_COMMUNITY): Payer: Self-pay

## 2022-07-08 LAB — RH IG WORKUP (INCLUDES ABO/RH)
Fetal Screen: NEGATIVE
Gestational Age(Wks): 40
Unit division: 0

## 2022-07-08 MED ORDER — IBUPROFEN 600 MG PO TABS
600.0000 mg | ORAL_TABLET | Freq: Four times a day (QID) | ORAL | 0 refills | Status: DC
  Filled 2022-07-08: qty 30, 8d supply, fill #0

## 2022-07-08 MED ORDER — NIFEDIPINE ER 30 MG PO TB24
30.0000 mg | ORAL_TABLET | Freq: Every day | ORAL | 0 refills | Status: DC
  Filled 2022-07-08: qty 30, 30d supply, fill #0

## 2022-07-08 MED ORDER — ACETAMINOPHEN 325 MG PO TABS
650.0000 mg | ORAL_TABLET | ORAL | 0 refills | Status: DC | PRN
  Filled 2022-07-08: qty 30, 3d supply, fill #0

## 2022-07-08 MED ORDER — FUROSEMIDE 20 MG PO TABS
20.0000 mg | ORAL_TABLET | Freq: Every day | ORAL | Status: DC
  Administered 2022-07-08: 20 mg via ORAL
  Filled 2022-07-08: qty 1

## 2022-07-08 MED ORDER — FUROSEMIDE 20 MG PO TABS
20.0000 mg | ORAL_TABLET | Freq: Every day | ORAL | 0 refills | Status: DC
  Filled 2022-07-08: qty 5, 5d supply, fill #0

## 2022-07-08 NOTE — Social Work (Signed)
CSW received consult for hx of Anxiety and Depression.  CSW met with MOB to offer support and complete assessment.    CSW entered the room introduced self, CSW role and reason for visit. CSW observed MOB feeding the infant, FOB sleeping in the bed and MOB's mom present. CSW offered privacy, MOB allowed FOB and her mom to remain in the room. CSW inquired about how mom was feeling and how the birthing process had been, MOB reported she was feeling good and the birthing process was painful. CSW acknowledged her concern. CSW inquired about MOB'S Mental Health history. MOB reported she was diagnosed with Anxiety and Depression at about 21 years of age. CSW inquired about treatment. MOB reported she was taking Lexapro, Buspar, and Trazidone prior to pregnancy but stopped taking the medication once she found out she was pregnant. MOB reported she was beeing seen by a therapist at [facility] up until Jan or Bed of this year. MOB reported stable mood and no new concerns since stopping treatment. CSW inquired if MOB plans to restart medication now that she has delivered. MOB stated " I don't know yet, I want to talk to my doctor first". CSW encouraged MOB to reach out to her provider and utilize her supports. MOB identified her mom and FOB as her supports. CSW assessed for safety, MOB denied any SI or HI.   CSW provided education regarding the baby blues period vs. perinatal mood disorders, discussed treatment and gave resources for mental health follow up if concerns arise.  CSW recommends self-evaluation during the postpartum time period using the New Mom Checklist from Postpartum Progress and encouraged MOB to contact a medical professional if symptoms are noted at any time.    CSW provided review of Sudden Infant Death Syndrome (SIDS) precautions. MOB identified [facility] for infants follow up care. MOB reported they have all necessary item for the infant including a bassinet for the infant to sleep.    CSW identifies no further need for intervention and no barriers to discharge at this time.   Letta Kocher, [place] Social Worker [phone]

## 2022-07-08 NOTE — Lactation Note (Signed)
This note was copied from a baby's chart. Lactation Consultation Note  Patient Name: Deanna Campbell Date: 07/08/2022 Reason for consult: Follow-up assessment;1st time breastfeeding;Primapara;Term +DAT Age:20 hours  LC in to visit with P1 Mom of term baby.  Mom wanting an early DC.  Baby at 5% weight loss with good output.   Baby's bilirubin level 7.4 at 28 hrs old, well below light level.  Baby has been feeding much better after a slow first 12 hrs. Some nipple tenderness and redness noted on tip.  Comfort Gels provided with instructions on use.  LC assessed baby's oral anatomy due to identifying a thin, short lingual frenulum.  This is not at the tongue tip, but slightly behind, tongue is not heart shaped.  Palate is high as well.  Baby able to cup finger and vigorously suck.  LC assisted parent to use cross cradle hold and have adequate pillow support a height of breasts.  Mom guided to use a U hold support of breast to sandwich breast for a deeper latch, once baby opened widely.  Latch was comfortable (Mom had been using cradle hold) and baby sucking with deep jaw extensions and pauses identifying regular swallows.  Taught Mom to compress breast during sucking to increase milk transfer.  Encouraged STS with baby as much as possible, watching for feeding cues.  Mom also encouraged to hand express milk into spoon to offer more volume.  Engorgement prevention and treatment reviewed.  Mom agreeable to OP lactation f/u, talked about the benefits of this. STORK pump received. Mom aware of OP lactation support and encouraged to call prn.   LATCH Score Latch: Grasps breast easily, tongue down, lips flanged, rhythmical sucking.  Audible Swallowing: Spontaneous and intermittent  Type of Nipple: Everted at rest and after stimulation  Comfort (Breast/Nipple): Filling, red/small blisters or bruises, mild/mod discomfort (tips of nipples abraded)  Hold (Positioning): Assistance needed  to correctly position infant at breast and maintain latch.  LATCH Score: 8   Lactation Tools Discussed/Used Tools: Comfort gels  Interventions Interventions: Breast feeding basics reviewed;Assisted with latch;Skin to skin;Breast massage;Hand express;Breast compression;Adjust position;Support pillows;Position options;Expressed milk;Comfort gels;Education  Discharge Discharge Education: Engorgement and breast care;Warning signs for feeding baby;Outpatient recommendation;Outpatient Epic message sent Pump: Stork Pump (STORK pump received)  Consult Status Consult Status: Complete Date: 07/08/22 Follow-up type: Out-patient    [name] 07/08/2022, 11:31 AM

## 2022-07-16 ENCOUNTER — Telehealth (HOSPITAL_COMMUNITY): Payer: Self-pay | Admitting: *Deleted

## 2022-07-16 NOTE — Telephone Encounter (Signed)
Voice mailbox is full. Unable to leave message.   Duffy Rhody, RN 07-16-2022 at 10:22am

## 2022-07-21 ENCOUNTER — Ambulatory Visit: Payer: Medicaid Other

## 2022-07-28 ENCOUNTER — Inpatient Hospital Stay (HOSPITAL_COMMUNITY)
Admission: AD | Admit: 2022-07-28 | Discharge: 2022-07-28 | Disposition: A | Discharge status: Home / Self Care | Payer: Medicaid Other | Attending: Family Medicine | Admitting: Family Medicine

## 2022-07-28 DIAGNOSIS — K5900 Constipation, unspecified: Secondary | ICD-10-CM | POA: Insufficient documentation

## 2022-07-28 DIAGNOSIS — O9089 Other complications of the puerperium, not elsewhere classified: Secondary | ICD-10-CM | POA: Diagnosis not present

## 2022-07-28 DIAGNOSIS — N760 Acute vaginitis: Secondary | ICD-10-CM | POA: Insufficient documentation

## 2022-07-28 DIAGNOSIS — R102 Pelvic and perineal pain: Secondary | ICD-10-CM

## 2022-07-28 DIAGNOSIS — O8613 Vaginitis following delivery: Secondary | ICD-10-CM

## 2022-07-28 LAB — URINALYSIS, ROUTINE W REFLEX MICROSCOPIC
Bilirubin Urine: NEGATIVE
Glucose, UA: NEGATIVE mg/dL
Ketones, ur: NEGATIVE mg/dL
Nitrite: NEGATIVE
Protein, ur: NEGATIVE mg/dL
Specific Gravity, Urine: 1.024 (ref 1.005–1.030)
pH: 5 (ref 5.0–8.0)

## 2022-07-28 LAB — WET PREP, GENITAL
Clue Cells Wet Prep HPF POC: NONE SEEN
Sperm: NONE SEEN
Trich, Wet Prep: NONE SEEN
WBC, Wet Prep HPF POC: >=10 — AB (ref <10)
Yeast Wet Prep HPF POC: NONE SEEN

## 2022-07-28 MED ORDER — METRONIDAZOLE 500 MG PO TABS: 500.0000 mg | ORAL_TABLET | Freq: Two times a day (BID) | ORAL | 0 refills | Status: DC

## 2022-07-28 NOTE — MAU Note (Signed)
Deanna Campbell is a 21 y.o.  here in MAU reporting: had an episiotomy with her delivery, vag del 8/7. There is a really bad odor.  Burns after she  pees. Has not had intercourse. Still bleeding, but a small amt.  No fever.  Onset of complaint: first noted odor yesterday. Pain score: 6 Vitals:   07/28/22 1525  BP: 106/63  Pulse: 87  Resp: 16  Temp: 97.9 F (36.6 C)  SpO2: 99%      Lab orders placed from triage:  none

## 2022-07-28 NOTE — MAU Provider Note (Signed)
History     CSN: [id number]  Arrival date and time: 07/28/22 1503   Event Date/Time   First Provider Initiated Contact with Patient 07/28/22 1536      Chief Complaint  Patient presents with   vag odor   21 y.o. G1P1 s/p VAVD w/episiotomy 3 weeks ago presenting with vaginal malodor x2 days. Reports brown vaginal discharge that started at the same time. Also reports burning pain after she urinates. Denies other urinary sx. No sex since delivery or using tampons. Reports starting to use soap in the vaginal area a couple days ago. Endorses constipation since pregnancy but recently took 8 capfuls of Miralax and hasntt had problem.     OB History     Gravida  1   Para  1   Term  1   Preterm      AB      Living  1      SAB      IAB      Ectopic      Multiple  0   Live Births  1           Past Medical History:  Diagnosis Date   Anxiety    Depression     Past Surgical History:  Procedure Laterality Date   NO PAST SURGERIES      Family History  Problem Relation Age of Onset   Asthma Brother     Social History   Tobacco Use   Smoking status: Never   Smokeless tobacco: Never  Vaping Use   Vaping Use: Never used  Substance Use Topics   Alcohol use: Never   Drug use: Never    Allergies: No Known Allergies  Medications Prior to Admission  Medication Sig Dispense Refill Last Dose   acetaminophen (TYLENOL) 325 MG tablet Take 2 tablets (650 mg total) by mouth every 4 (four) hours as needed (for pain scale < 4). 30 tablet 0    furosemide (LASIX) 20 MG tablet Take 1 tablet (20 mg total) by mouth daily. 5 tablet 0    ibuprofen (ADVIL) 600 MG tablet Take 1 tablet (600 mg total) by mouth every 6 (six) hours. 30 tablet 0    NIFEdipine (ADALAT CC) 30 MG 24 hr tablet Take 1 tablet (30 mg total) by mouth daily. 30 tablet 0    pantoprazole (PROTONIX) 40 MG tablet Take 1 tablet (40 mg total) by mouth daily. 30 tablet 2    polyethylene glycol powder  (GLYCOLAX/MIRALAX) 17 GM/SCOOP powder Take 17 g by mouth daily as needed. 510 g 1    Prenatal Vit-Fe Fumarate-FA (MULTIVITAMIN-PRENATAL) 27-0.8 MG TABS tablet Take 1 tablet by mouth daily at 12 noon.       Review of Systems  Constitutional:  Negative for chills and fever.  Gastrointestinal:  Positive for constipation. Negative for abdominal pain.  Genitourinary:  Positive for dysuria and vaginal discharge. Negative for frequency, hematuria and urgency.   Physical Exam   Blood pressure 113/63, pulse 73, temperature 97.9 F (36.6 C), temperature source Oral, resp. rate 16, height 5\' 6"  (1.676 m), weight 69.7 kg, SpO2 99 %, currently breastfeeding.  Physical Exam Vitals and nursing note reviewed. Exam conducted with a chaperone present.  Constitutional:      General: She is not in acute distress.    Appearance: Normal appearance.  HENT:     Head: Normocephalic and atraumatic.  Cardiovascular:     Rate and Rhythm: Normal rate.  Pulmonary:  Effort: Pulmonary effort is normal. No respiratory distress.  Abdominal:     General: There is no distension.     Palpations: Abdomen is soft. There is no mass.     Tenderness: There is no abdominal tenderness. There is no guarding or rebound.     Hernia: No hernia is present.  Genitourinary:   Musculoskeletal:        General: Normal range of motion.     Cervical back: Normal range of motion.  Skin:    General: Skin is warm and dry.  Neurological:     General: No focal deficit present.     Mental Status: She is alert and oriented to person, place, and time.  Psychiatric:        Mood and Affect: Mood normal.        Behavior: Behavior normal.    Results for orders placed or performed during the hospital encounter of 07/28/22 (from the past 24 hour(s))  Wet prep, genital     Status: Abnormal   Collection Time: 07/28/22  3:37 PM   Specimen: Vaginal  Result Value Ref Range   Yeast Wet Prep HPF POC NONE SEEN NONE SEEN   Trich, Wet Prep  NONE SEEN NONE SEEN   Clue Cells Wet Prep HPF POC NONE SEEN NONE SEEN   WBC, Wet Prep HPF POC >=10 (A) <10   Sperm NONE SEEN   Urinalysis, Routine w reflex microscopic Urine, Clean Catch     Status: Abnormal   Collection Time: 07/28/22  4:00 PM  Result Value Ref Range   Color, Urine YELLOW YELLOW   APPearance HAZY (A) CLEAR   Specific Gravity, Urine 1.024 1.005 - 1.030   pH 5.0 5.0 - 8.0   Glucose, UA NEGATIVE NEGATIVE mg/dL   Hgb urine dipstick SMALL (A) NEGATIVE   Bilirubin Urine NEGATIVE NEGATIVE   Ketones, ur NEGATIVE NEGATIVE mg/dL   Protein, ur NEGATIVE NEGATIVE mg/dL   Nitrite NEGATIVE NEGATIVE   Leukocytes,Ua MODERATE (A) NEGATIVE   RBC / HPF 0-5 0 - 5 RBC/hpf   WBC, UA 11-20 0 - 5 WBC/hpf   Bacteria, UA RARE (A) NONE SEEN   Squamous Epithelial / LPF 0-5 0 - 5   Mucus PRESENT    MAU Course  Procedures  MDM Labs ordered and reviewed. UA not obvious for UTI, culture sent. No signs of wound infection but will treat for vaginitis. Stable for discharge home.   Assessment and Plan   1. Postpartum perineal pain   2. Vaginitis affecting pregnancy, postpartum    Discharge home Follow up at [facility] as scheduled Rx Flagyl Return precautions Pelvic rest  Allergies as of 07/28/2022   No Known Allergies      Medication List     STOP taking these medications    furosemide 20 MG tablet Commonly known as: LASIX       TAKE these medications    acetaminophen 325 MG tablet Commonly known as: Tylenol Take 2 tablets (650 mg total) by mouth every 4 (four) hours as needed (for pain scale < 4).   ibuprofen 600 MG tablet Commonly known as: ADVIL Take 1 tablet (600 mg total) by mouth every 6 (six) hours.   metroNIDAZOLE 500 MG tablet Commonly known as: FLAGYL Take 1 tablet (500 mg total) by mouth 2 (two) times daily.   multivitamin-prenatal 27-0.8 MG Tabs tablet Take 1 tablet by mouth daily at 12 noon.   NIFEdipine 30 MG 24 hr tablet Commonly known as: ADALAT  CC Take 1 tablet (30 mg total) by mouth daily.   pantoprazole 40 MG tablet Commonly known as: Protonix Take 1 tablet (40 mg total) by mouth daily.   polyethylene glycol powder 17 GM/SCOOP powder Commonly known as: GLYCOLAX/MIRALAX Take 17 g by mouth daily as needed.        Donette Larry, CNM 07/28/2022, 4:59 PM

## 2022-07-28 NOTE — MAU Provider Note (Signed)
Deanna Campbell is a 21 y.o. female who is a G1P001 who is post-partum 3 weeks presenting for vaginal odor. History     CSN: [id number]  Arrival date and time: 07/28/22 1503   Event Date/Time   First Provider Initiated Contact with Patient 07/28/22 1536      Chief Complaint  Patient presents with   vag odor   Patient reports that she started having a foul odor occurring from her vagina 1 day ago. She has had additional symptoms of brown vaginal discharge for 2-3 days, and pain with urination. She cannot localize the pain to her urethra or vagina. Of note she is 3 weeks post partum vaginal delivery in which she required episiotomy. Patient states she has had bleeding but does fill pad that she wears. Denies nausea, vomiting, fever, urinary frequency or urgency. Denies sexual activity since delivery of her baby.    OB History     Gravida  1   Para  1   Term  1   Preterm      AB      Living  1      SAB      IAB      Ectopic      Multiple  0   Live Births  1           Past Medical History:  Diagnosis Date   Anxiety    Depression     Past Surgical History:  Procedure Laterality Date   NO PAST SURGERIES      Family History  Problem Relation Age of Onset   Asthma Brother     Social History   Tobacco Use   Smoking status: Never   Smokeless tobacco: Never  Vaping Use   Vaping Use: Never used  Substance Use Topics   Alcohol use: Never   Drug use: Never    Allergies: No Known Allergies  Medications Prior to Admission  Medication Sig Dispense Refill Last Dose   acetaminophen (TYLENOL) 325 MG tablet Take 2 tablets (650 mg total) by mouth every 4 (four) hours as needed (for pain scale < 4). 30 tablet 0    furosemide (LASIX) 20 MG tablet Take 1 tablet (20 mg total) by mouth daily. 5 tablet 0    ibuprofen (ADVIL) 600 MG tablet Take 1 tablet (600 mg total) by mouth every 6 (six) hours. 30 tablet 0    NIFEdipine (ADALAT CC) 30 MG 24 hr tablet Take 1  tablet (30 mg total) by mouth daily. 30 tablet 0    pantoprazole (PROTONIX) 40 MG tablet Take 1 tablet (40 mg total) by mouth daily. 30 tablet 2    polyethylene glycol powder (GLYCOLAX/MIRALAX) 17 GM/SCOOP powder Take 17 g by mouth daily as needed. 510 g 1    Prenatal Vit-Fe Fumarate-FA (MULTIVITAMIN-PRENATAL) 27-0.8 MG TABS tablet Take 1 tablet by mouth daily at 12 noon.       Review of Systems  Constitutional:  Negative for activity change, appetite change, chills and fever.  HENT:  Negative for congestion.   Respiratory:  Negative for shortness of breath.   Gastrointestinal:  Positive for abdominal pain and constipation. Negative for abdominal distention, diarrhea and vomiting.  Genitourinary:  Positive for vaginal bleeding, vaginal discharge and vaginal pain. Negative for difficulty urinating and frequency.  Musculoskeletal:  Positive for back pain.  Neurological:  Negative for headaches.   Physical Exam   Blood pressure 106/63, pulse 87, temperature 97.9 F (36.6 C), temperature source Oral,  resp. rate 16, height 5\' 6"  (1.676 m), weight 69.7 kg, SpO2 99 %, currently breastfeeding.  Physical Exam Exam conducted with a chaperone present.  Constitutional:      Appearance: Normal appearance. She is normal weight.  HENT:     Head: Normocephalic and atraumatic.     Right Ear: External ear normal.     Left Ear: External ear normal.     Nose: Nose normal.  Eyes:     Conjunctiva/sclera: Conjunctivae normal.  Cardiovascular:     Rate and Rhythm: Normal rate and regular rhythm.     Pulses: Normal pulses.  Pulmonary:     Effort: Pulmonary effort is normal.     Breath sounds: Normal breath sounds.  Abdominal:     General: Abdomen is flat.     Palpations: Abdomen is soft.  Genitourinary:    Pubic Area: No rash.      Labia:        Right: No lesion or injury.        Left: No lesion or injury.      Urethra: No urethral lesion.     Comments: Normal external genitalia. Pooling or  brown/yellow/serous fluid in posterior vagina.  Episiotomy repair healing well, no signs of erythema or purulent drainage. Appropriately tender to palpation. Skin:    General: Skin is warm and dry.  Neurological:     General: No focal deficit present.     Mental Status: She is alert.  Psychiatric:        Mood and Affect: Mood normal.        Behavior: Behavior normal.    Labs and Imaging Results for orders placed or performed during the hospital encounter of 07/28/22 (from the past 24 hour(s))  Wet prep, genital     Status: Abnormal   Collection Time: 07/28/22  3:37 PM   Specimen: Vaginal  Result Value Ref Range   Yeast Wet Prep HPF POC NONE SEEN NONE SEEN   Trich, Wet Prep NONE SEEN NONE SEEN   Clue Cells Wet Prep HPF POC NONE SEEN NONE SEEN   WBC, Wet Prep HPF POC >=10 (A) <10   Sperm NONE SEEN   Urinalysis, Routine w reflex microscopic Urine, Clean Catch     Status: Abnormal   Collection Time: 07/28/22  4:00 PM  Result Value Ref Range   Color, Urine YELLOW YELLOW   APPearance HAZY (A) CLEAR   Specific Gravity, Urine 1.024 1.005 - 1.030   pH 5.0 5.0 - 8.0   Glucose, UA NEGATIVE NEGATIVE mg/dL   Hgb urine dipstick SMALL (A) NEGATIVE   Bilirubin Urine NEGATIVE NEGATIVE   Ketones, ur NEGATIVE NEGATIVE mg/dL   Protein, ur NEGATIVE NEGATIVE mg/dL   Nitrite NEGATIVE NEGATIVE   Leukocytes,Ua MODERATE (A) NEGATIVE   RBC / HPF 0-5 0 - 5 RBC/hpf   WBC, UA 11-20 0 - 5 WBC/hpf   Bacteria, UA RARE (A) NONE SEEN   Squamous Epithelial / LPF 0-5 0 - 5   Mucus PRESENT      MAU Course  Procedures  MDM Differential includes BV, candida infection, episiotomy infection, and UTI. Plan to follow-up wet prep and UA results.   Wet prep positive for leukocytes. UA suspicious for possibly urinary tract infection. In setting of delivery course complicated by episiotomy, urinary sample is likely contaminated with inflammatory response of vagina. Plan to empirically treat for vaginitis, and  follow-up urine culture.   Assessment and Plan   Acute vaginitis Patient is stable  without signs of acute systemic infection.   Plan: Flagyl 500mg  BID for days, urine culture  Disposition: Discharge home, patient informed of return precautions for fever, hemorrhage, severe pain.  Follow-up: Instructed to follow-up at scheduled 6wk postpartum visit on 08/28/22 [facility]  [facility], MD, PGY-1 07/28/2022, 4:04 PM

## 2022-07-29 LAB — URINE CULTURE: Culture: <10000 — AB

## 2022-08-09 ENCOUNTER — Inpatient Hospital Stay (HOSPITAL_COMMUNITY)
Admission: AD | Admit: 2022-08-09 | Discharge: 2022-08-09 | Disposition: A | Discharge status: Home / Self Care | Payer: Medicaid Other | Attending: Obstetrics & Gynecology | Admitting: Obstetrics & Gynecology

## 2022-08-09 ENCOUNTER — Encounter (HOSPITAL_COMMUNITY): Payer: Self-pay | Admitting: Obstetrics & Gynecology

## 2022-08-09 DIAGNOSIS — O9089 Other complications of the puerperium, not elsewhere classified: Secondary | ICD-10-CM | POA: Diagnosis not present

## 2022-08-09 DIAGNOSIS — N6459 Other signs and symptoms in breast: Secondary | ICD-10-CM | POA: Diagnosis present

## 2022-08-09 DIAGNOSIS — O9279 Other disorders of lactation: Secondary | ICD-10-CM

## 2022-08-09 MED ORDER — IBUPROFEN 600 MG PO TABS: 600.0000 mg | ORAL_TABLET | Freq: Four times a day (QID) | ORAL | 3 refills | Status: DC | PRN

## 2022-08-09 MED ORDER — IBUPROFEN 600 MG PO TABS
600.0000 mg | ORAL_TABLET | Freq: Once | ORAL | Status: AC
  Administered 2022-08-09: 600 mg via ORAL
  Filled 2022-08-09: qty 1

## 2022-08-09 MED ORDER — IBUPROFEN 600 MG PO TABS: 600.0000 mg | ORAL_TABLET | Freq: Four times a day (QID) | ORAL | 0 refills | Status: DC

## 2022-08-09 NOTE — MAU Note (Signed)
.  Deanna Campbell is a 21 y.o. at Unknown here in MAU reporting: PP 07/07/22 vag. C/o right breast pain and swelling that started this morning. Pt is pumping regularly to feed baby.  LMP:  Onset of complaint: this morning Pain score: 7 There were no vitals filed for this visit.   FHT:n/a  Lab orders placed from triage:

## 2022-08-09 NOTE — MAU Note (Signed)
Breast pump to bedside

## 2022-08-09 NOTE — MAU Provider Note (Signed)
History     CSN: [id number]  Arrival date and time: 08/09/22 1443   None     Chief Complaint  Patient presents with   Breast Pain   HPI Deanna Campbell is a 21 y.o. G1P1001 s/p VAVD on 07/07/2022 who presents to MAU for breast pain. Patient reports right breast started this morning. She reports there is a constant "swollen" feeling to it. Breast feels hard. She reports that she usually pumps every 3 hours, however last night she overslept and did not pump. She woke up at 6am and pumped, and then pumped again around 10:30am. She has not pumped any more since. She feels like there is a "hard knot", however denies redness, streaking of the breast, warmth, fever, or flu-like symptoms.    OB History     Gravida  1   Para  1   Term  1   Preterm      AB      Living  1      SAB      IAB      Ectopic      Multiple  0   Live Births  1           Past Medical History:  Diagnosis Date   Anxiety    Depression     Past Surgical History:  Procedure Laterality Date   NO PAST SURGERIES      Family History  Problem Relation Age of Onset   Asthma Brother     Social History   Tobacco Use   Smoking status: Never   Smokeless tobacco: Never  Vaping Use   Vaping Use: Never used  Substance Use Topics   Alcohol use: Never   Drug use: Never    Allergies: No Known Allergies  Medications Prior to Admission  Medication Sig Dispense Refill Last Dose   acetaminophen (TYLENOL) 325 MG tablet Take 2 tablets (650 mg total) by mouth every 4 (four) hours as needed (for pain scale < 4). 30 tablet 0    metroNIDAZOLE (FLAGYL) 500 MG tablet Take 1 tablet (500 mg total) by mouth 2 (two) times daily. 14 tablet 0    NIFEdipine (ADALAT CC) 30 MG 24 hr tablet Take 1 tablet (30 mg total) by mouth daily. 30 tablet 0    pantoprazole (PROTONIX) 40 MG tablet Take 1 tablet (40 mg total) by mouth daily. 30 tablet 2    polyethylene glycol powder (GLYCOLAX/MIRALAX) 17 GM/SCOOP powder Take 17 g  by mouth daily as needed. 510 g 1    Prenatal Vit-Fe Fumarate-FA (MULTIVITAMIN-PRENATAL) 27-0.8 MG TABS tablet Take 1 tablet by mouth daily at 12 noon.      [DISCONTINUED] ibuprofen (ADVIL) 600 MG tablet Take 1 tablet (600 mg total) by mouth every 6 (six) hours. 30 tablet 0    Review of Systems  Constitutional: Negative.   Respiratory: Negative.    Cardiovascular: Negative.   Gastrointestinal: Negative.   Genitourinary: Negative.   Musculoskeletal:        Breast pain   Neurological: Negative.    Physical Exam   Blood pressure 124/81, pulse 94, temperature 98.1 F (36.7 C), resp. rate 18, height 5\' 6"  (1.676 m), weight 68.9 kg, currently breastfeeding.  Physical Exam Vitals and nursing note reviewed.  Constitutional:      General: She is not in acute distress. Eyes:     Pupils: Pupils are equal, round, and reactive to light.  Cardiovascular:     Rate and Rhythm: Tachycardia  present.  Pulmonary:     Effort: Pulmonary effort is normal.  Chest:     Comments: Right breast engorged. No erythema, redness, streaking, or lumps/masses Musculoskeletal:        General: Normal range of motion.  Skin:    General: Skin is warm and dry.  Neurological:     General: No focal deficit present.     Mental Status: She is alert and oriented to person, place, and time.  Psychiatric:        Mood and Affect: Mood normal.        Behavior: Behavior normal.        Judgment: Judgment normal.     MAU Course  Procedures  MDM Suspect patient is engorged given presentation and symptom. I have a low suspicion of mastitis at this time. Given that patient has only pumped 2x since 6am, patient was given breast pump and encouraged to pump. I also provided ice pack and ibuprofen was ordered. Offered lactation consultation however declines. Will see outpatient lactation on Monday. I reviewed with patient importance of frequent emptying of breasts while awake (Q2H) and ice packs. I reviewed strict return  precautions/signs of mastitis and abscess.   Assessment and Plan  Postpartum Breast engorgement  - Discharge home in stable condition - Strict return precautions. Return to MAU sooner or as needed for new/worsening symptoms - Outpatient lactation on Monday - Keep postpartum visit as scheduled   Brand Males, CNM 08/09/2022, 5:11 PM

## 2022-08-09 NOTE — MAU Note (Signed)
Ice packs to bedside for pt to apply after she finishes pumping.

## 2022-08-18 ENCOUNTER — Ambulatory Visit: Payer: Medicaid Other | Admitting: Family Medicine

## 2022-08-28 ENCOUNTER — Encounter: Payer: Self-pay | Admitting: Obstetrics & Gynecology

## 2022-08-28 ENCOUNTER — Ambulatory Visit (INDEPENDENT_AMBULATORY_CARE_PROVIDER_SITE_OTHER): Payer: Medicaid Other | Admitting: Obstetrics & Gynecology

## 2022-08-28 DIAGNOSIS — Z975 Presence of (intrauterine) contraceptive device: Secondary | ICD-10-CM | POA: Diagnosis not present

## 2022-08-28 NOTE — Progress Notes (Signed)
Post Partum Visit Note  Deanna Campbell is a 21 y.o. G21P1001 female who presents for a postpartum visit. She is 7 weeks postpartum following a vacuum-assisted vaginal delivery.  I have fully reviewed the prenatal and intrapartum course. The delivery was at 40 gestational weeks.  Anesthesia: epidural. Postpartum course has been good. Baby is doing well. Baby is feeding by both breast and bottle - Daron Offer . Bleeding staining only. Bowel function is normal. Bladder function is normal. Patient is sexually active. Contraception method is IUD, Mirena placed post placental delivery. Postpartum depression screening: negative.   Edinburgh Postnatal Depression Scale - 08/28/22 1312       Edinburgh Postnatal Depression Scale:  In the Past 7 Days   I have been able to laugh and see the funny side of things. 0    I have looked forward with enjoyment to things. 0    I have blamed myself unnecessarily when things went wrong. 2    I have been anxious or worried for no good reason. 0    I have felt scared or panicky for no good reason. 0    Things have been getting on top of me. 0    I have been so unhappy that I have had difficulty sleeping. 0    I have felt sad or miserable. 1    I have been so unhappy that I have been crying. 0    The thought of harming myself has occurred to me. 0    Edinburgh Postnatal Depression Scale Total 3            Health Maintenance Due  Topic Date Due   COVID-19 Vaccine (4 - Pfizer series) 12/05/2020   INFLUENZA VACCINE  07/01/2022    The following portions of the patient's history were reviewed and updated as appropriate: allergies, current medications, past family history, past medical history, past social history, past surgical history, and problem list.  Review of Systems Pertinent items noted in HPI and remainder of comprehensive ROS otherwise negative.  Objective:  BP 115/76   Pulse 67   Ht 5\' 7"  (1.702 m)   Wt 146 lb 12.8 oz (66.6 kg)   BMI  22.99 kg/m   Chaperone present General:  alert and no distress   Breasts:  normal  Lungs: clear to auscultation bilaterally  Heart:  regular rate and rhythm  Abdomen: soft, non-tender; bowel sounds normal; no masses,  no organomegaly   GU exam:  normal, IUD strings seen and cut to 3 cm in length.        Assessment:   Normal postpartum exam.   Plan:   Essential components of care per ACOG recommendations:  1.  Mood and well being: Patient with negative depression screening today. Reviewed local resources for support.  - Patient tobacco use? No.   - hx of drug use? No.    2. Infant care and feeding:  -Patient currently breastmilk feeding? Yes. Reviewed importance of draining breast regularly to support lactation.  -Social determinants of health (SDOH) reviewed in EPIC. No concerns.  3. Sexuality, contraception and birth spacing - Patient does not want a pregnancy in the next year.   - Reviewed reproductive life planning.  Has IUD in place, can be in place up to 8 years.   - Discussed birth spacing of 18 months  4. Sleep and fatigue -Encouraged family/partner/community support of 4 hrs of uninterrupted sleep to help with mood and fatigue  5. Physical Recovery  -  Discussed patients delivery and complications. She describes her labor as good. - Patient had a Vaginal, no problems at delivery. Patient had a 2nd degree laceration. Perineal healing reviewed. Patient expressed understanding - Patient has urinary incontinence? No. - Patient is safe to resume physical and sexual activity  6.  Health Maintenance - HM due items addressed Yes - Pap smear due at age 48   Verita Schneiders, MD [facility], [place]

## 2022-12-09 ENCOUNTER — Ambulatory Visit (INDEPENDENT_AMBULATORY_CARE_PROVIDER_SITE_OTHER): Payer: Medicaid Other | Admitting: Obstetrics and Gynecology

## 2022-12-09 ENCOUNTER — Ambulatory Visit
Admission: RE | Admit: 2022-12-09 | Discharge: 2022-12-09 | Disposition: A | Discharge status: Home / Self Care | Payer: Medicaid Other | Source: Ambulatory Visit | Attending: Obstetrics and Gynecology | Admitting: Obstetrics and Gynecology

## 2022-12-09 ENCOUNTER — Encounter: Payer: Self-pay | Admitting: Obstetrics and Gynecology

## 2022-12-09 VITALS — BP 122/67 | HR 83 | Wt 154.0 lb

## 2022-12-09 DIAGNOSIS — R102 Pelvic and perineal pain: Secondary | ICD-10-CM | POA: Insufficient documentation

## 2022-12-09 DIAGNOSIS — Z30431 Encounter for routine checking of intrauterine contraceptive device: Secondary | ICD-10-CM | POA: Diagnosis not present

## 2022-12-09 NOTE — Progress Notes (Signed)
22 yo P1 here for IUD check. Patient had Mirena IUD inserted 07/2022 following placenta delivery. Strings were trimmed to 3 cm at her postpartum visit in 08/2022. Patient presents today reporting onset of lower abdominal pain and cramping over the past 2 days. Pain is worst with intercourse. She is without any other complaints  Past Medical History:  Diagnosis Date   Anxiety    Depression    Past Surgical History:  Procedure Laterality Date   NO PAST SURGERIES     Family History  Problem Relation Age of Onset   Asthma Brother    Social History   Tobacco Use   Smoking status: Never   Smokeless tobacco: Never  Vaping Use   Vaping Use: Never used  Substance Use Topics   Alcohol use: Never   Drug use: Never   ROS See pertinent in HPI. All other systems reviewed and non contributory Blood pressure 122/67, pulse 83, weight 154 lb (69.9 kg), currently breastfeeding. GENERAL: Well-developed, well-nourished female in no acute distress.  ABDOMEN: Soft, nontender, nondistended. No organomegaly. PELVIC: Normal external female genitalia. Vagina is pink and rugated.  Normal discharge. Normal appearing cervix with IUD strings curled into the anterior fornix. Uterus is normal in size. No adnexal mass or tenderness. Chaperone present during the pelvic exam EXTREMITIES: No cyanosis, clubbing, or edema, 2+ distal pulses.  A/P 22 yo here for IUD check and pelvic pain - IUD appear to be in the appropriate location - Pelvic ultrasound ordered to assess IUD location and rule out other etiology for pain such as ovarian cyst - Advised to use ibuprofen and heated pad for pain - Patient will be contacted with results

## 2022-12-09 NOTE — Addendum Note (Signed)
Addended by: Crosby Oyster on: 12/09/2022 09:41 AM   Modules accepted: Orders

## 2022-12-09 NOTE — Progress Notes (Signed)
3 days of cramping, wants IUD checked

## 2023-02-01 IMAGING — US US RENAL
1 series · 14 of 25 positions shown · non-contrast
Comparison: None available

CLINICAL DATA: Microscopic hematuria.

31 weeks pregnant.
EXAM:
RENAL / URINARY TRACT ULTRASOUND COMPLETE

[Series 1: us renal · 14 of 71 slices shown]
[im 1/71]
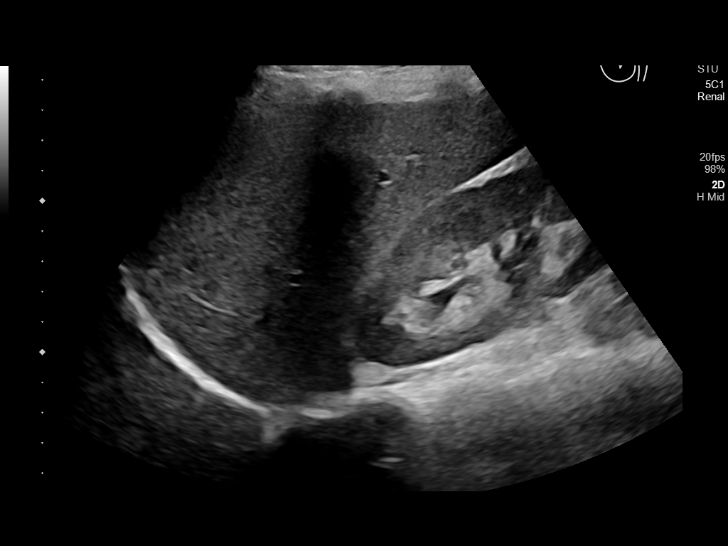
[im 6/71]
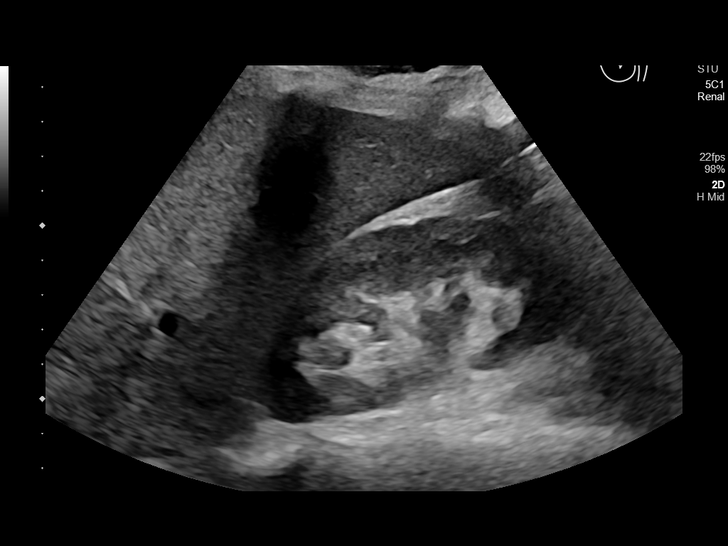
[im 12/71]
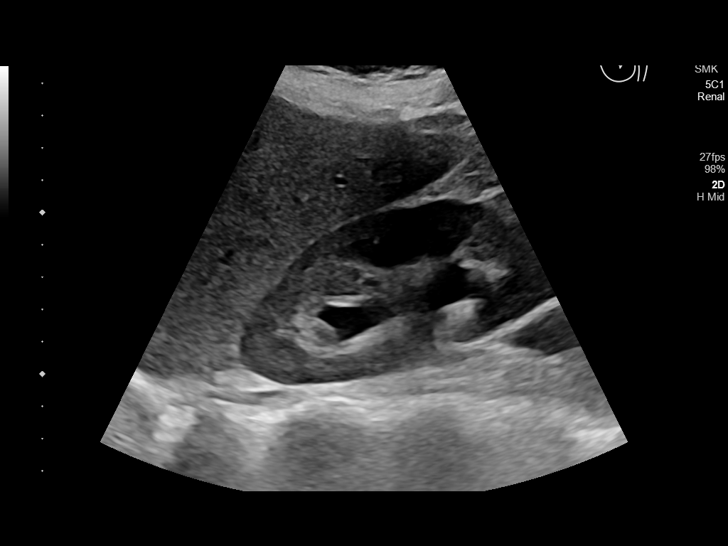
[im 18/71]
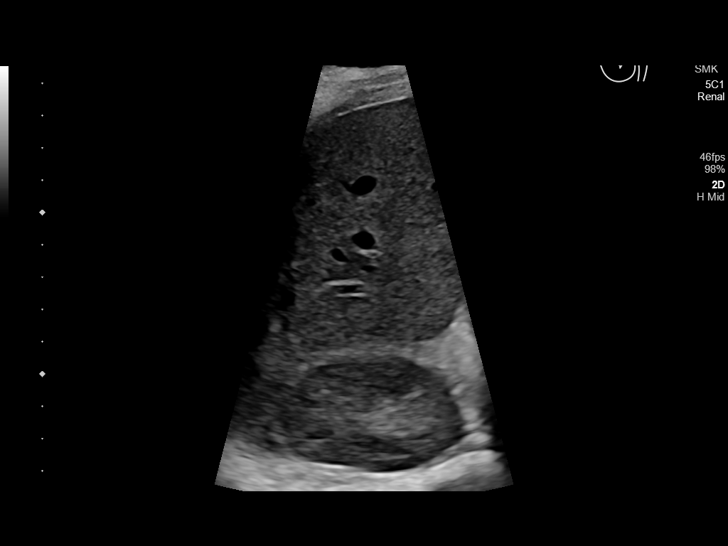
[im 24/71]
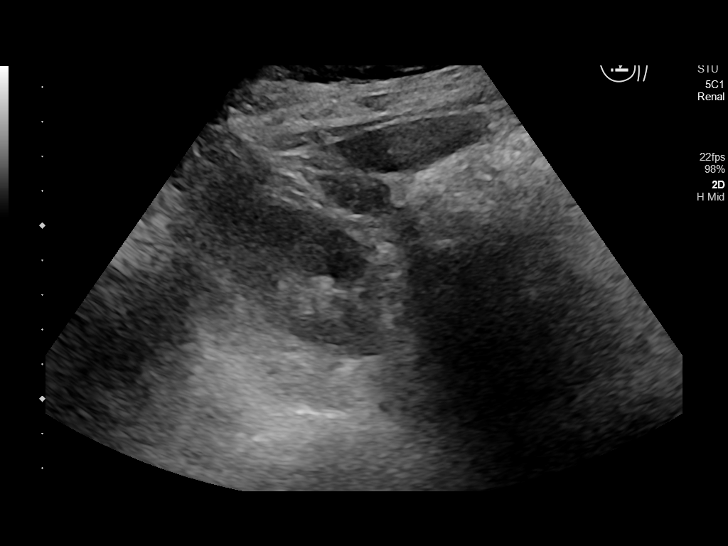
[im 27/71]
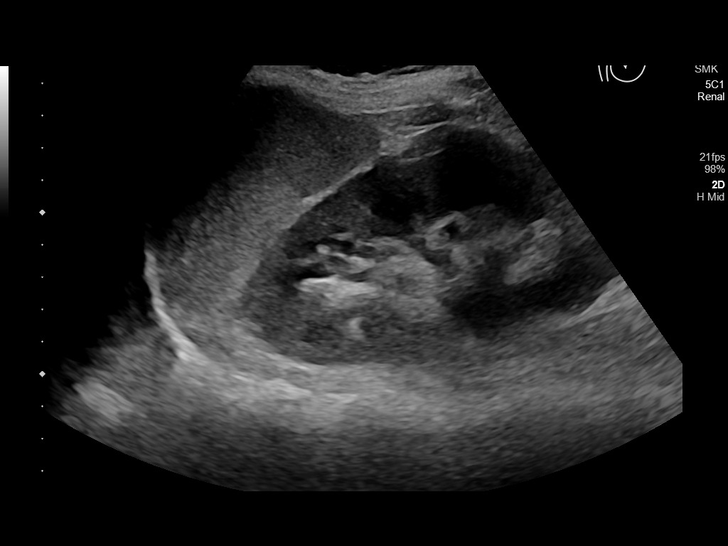
[im 33/71]
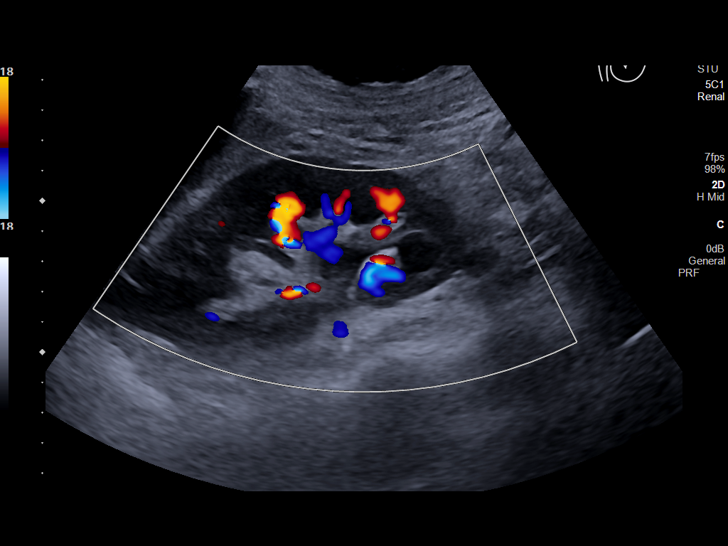
[im 38/71]
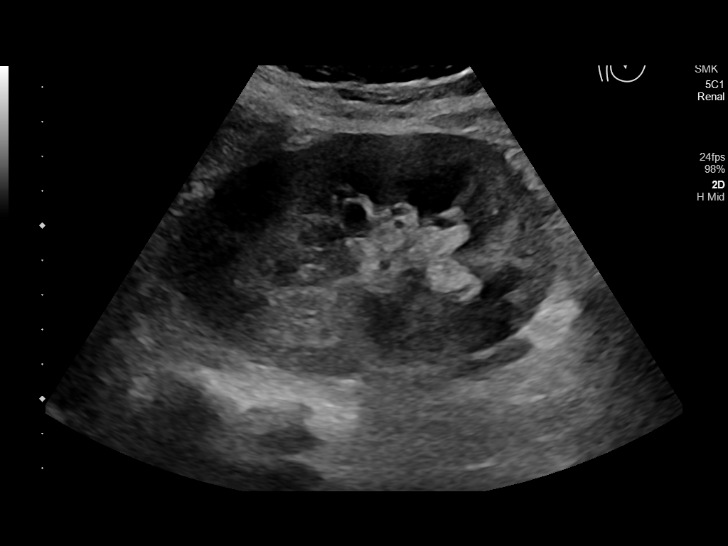
[im 44/71]
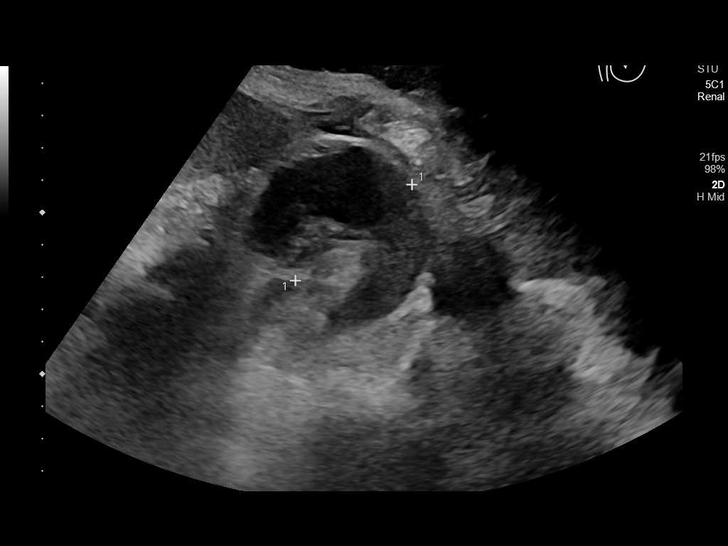
[im 47/71]
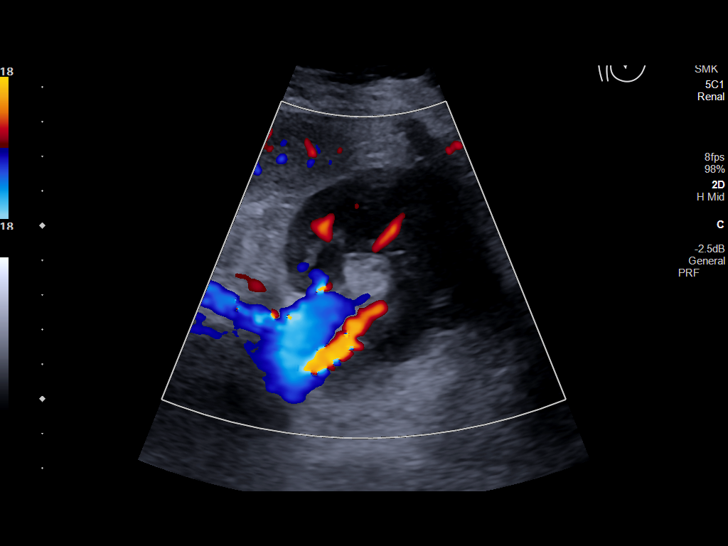
[im 53/71]
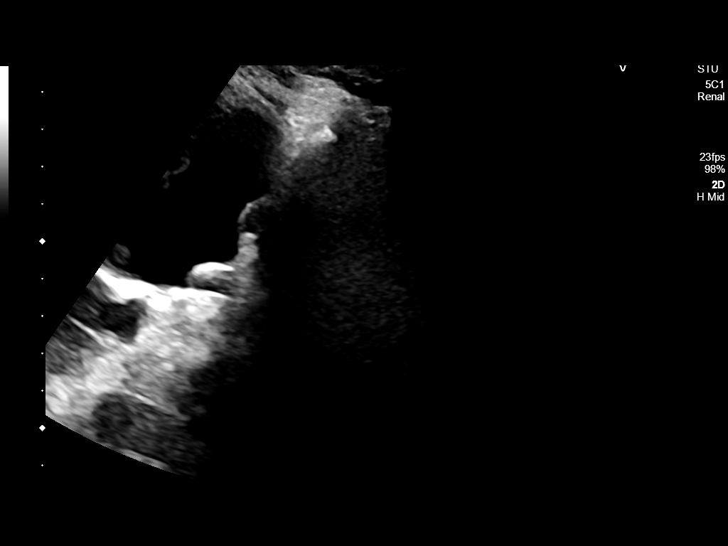
[im 59/71]
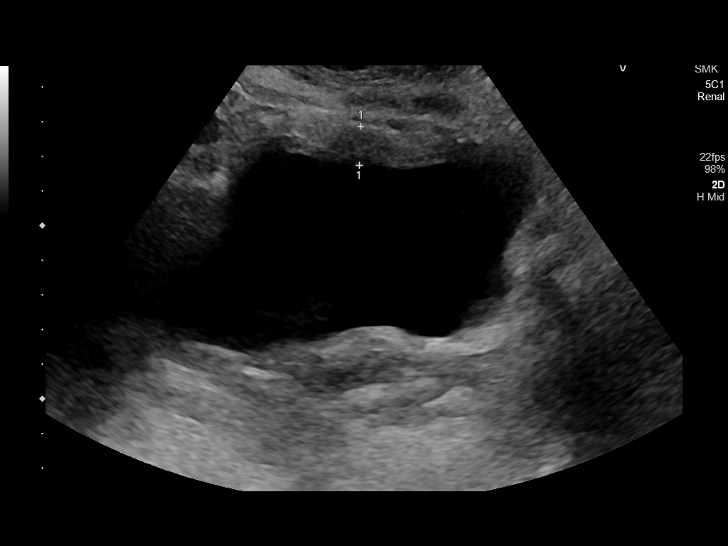
[im 65/71]
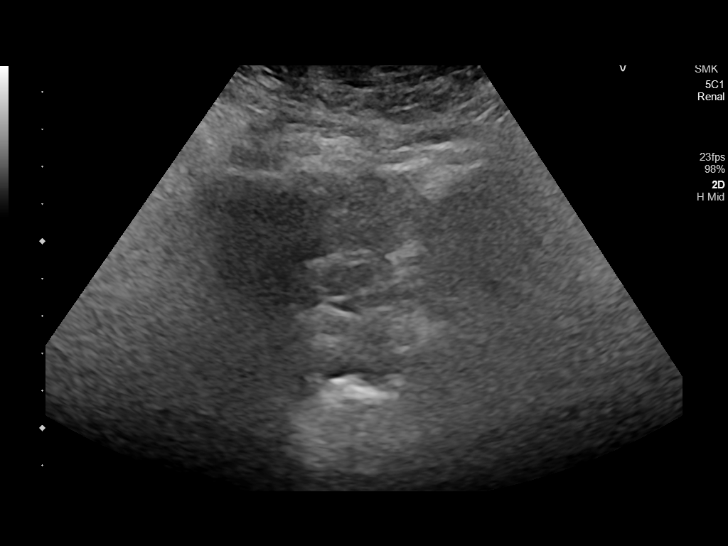
[im 71/71]
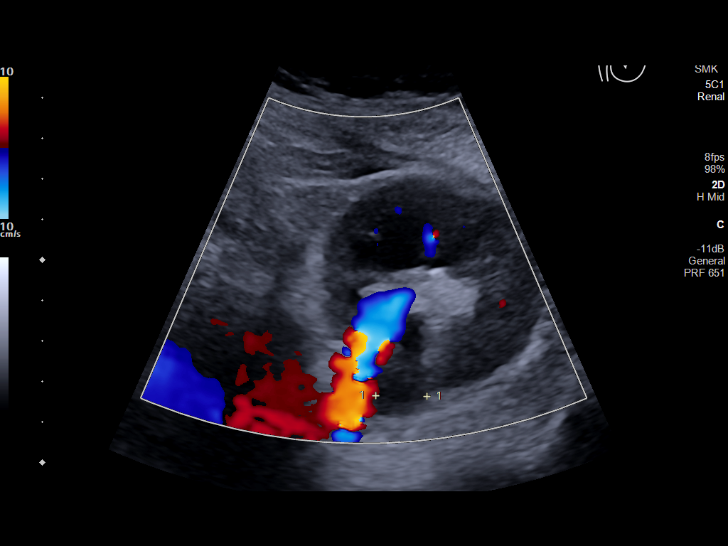

[14 of 25 positions shown; findings below may reference images not displayed]

FINDINGS: Right Kidney:

Renal measurements: 10.5 x 5.0 x 6.2 cm = volume: 173 mL.
Echogenicity within normal limits. Mild hydronephrosis. No mass.

Left Kidney:

Renal measurements: 11.6 x 7.3 x 5.6 cm = volume: 250 mL.
Echogenicity within normal limits. Mild hydronephrosis. No mass.

Bladder:

Diffuse bladder wall thickening likely due to under distension.
Chronic cystitis and outlet obstruction can have a similar
appearance.

Other:

None.
IMPRESSION: 1. Mild bilateral hydronephrosis.
2. Diffuse bladder wall thickening likely due to under distension.
Chronic cystitis and outlet obstruction can have a similar
appearance.

## 2024-06-01 ENCOUNTER — Other Ambulatory Visit (HOSPITAL_COMMUNITY): Payer: Self-pay | Admitting: Nurse Practitioner

## 2024-06-01 DIAGNOSIS — N644 Mastodynia: Secondary | ICD-10-CM

## 2024-06-14 ENCOUNTER — Ambulatory Visit (HOSPITAL_COMMUNITY)
Admission: RE | Admit: 2024-06-14 | Discharge: 2024-06-14 | Disposition: A | Discharge status: Home / Self Care | Source: Ambulatory Visit | Attending: Nurse Practitioner | Admitting: Nurse Practitioner

## 2024-06-14 DIAGNOSIS — N644 Mastodynia: Secondary | ICD-10-CM | POA: Insufficient documentation
